# Patient Record
Sex: Female | Born: 1975
Health system: Southern US, Community
[De-identification: ages and names within clinical notes are randomized; demographics above are authoritative.]

## PROBLEM LIST (undated history)

## (undated) DIAGNOSIS — Z87442 Personal history of urinary calculi: Secondary | ICD-10-CM

## (undated) DIAGNOSIS — D649 Anemia, unspecified: Secondary | ICD-10-CM

## (undated) HISTORY — PX: DILATION AND CURETTAGE OF UTERUS: SHX78

---

## 2009-01-23 ENCOUNTER — Ambulatory Visit: Payer: Self-pay | Admitting: Obstetrics and Gynecology

## 2009-02-13 ENCOUNTER — Ambulatory Visit: Payer: Self-pay | Admitting: Obstetrics and Gynecology

## 2013-03-04 ENCOUNTER — Encounter: Payer: Self-pay | Admitting: Internal Medicine

## 2013-03-15 ENCOUNTER — Encounter: Payer: Self-pay | Admitting: Internal Medicine

## 2013-04-15 ENCOUNTER — Encounter: Payer: Self-pay | Admitting: Internal Medicine

## 2013-05-16 ENCOUNTER — Encounter: Payer: Self-pay | Admitting: Internal Medicine

## 2013-09-25 ENCOUNTER — Ambulatory Visit: Payer: Self-pay | Admitting: Physical Medicine and Rehabilitation

## 2014-06-24 ENCOUNTER — Ambulatory Visit: Payer: Self-pay | Admitting: Urology

## 2014-07-01 ENCOUNTER — Ambulatory Visit: Payer: Self-pay

## 2014-09-13 ENCOUNTER — Other Ambulatory Visit: Payer: Self-pay | Admitting: Obstetrics and Gynecology

## 2014-09-13 DIAGNOSIS — Z1231 Encounter for screening mammogram for malignant neoplasm of breast: Secondary | ICD-10-CM

## 2014-09-26 ENCOUNTER — Ambulatory Visit
Admission: RE | Admit: 2014-09-26 | Discharge: 2014-09-26 | Disposition: A | Discharge status: Home / Self Care | Payer: 59 | Source: Ambulatory Visit | Attending: Obstetrics and Gynecology | Admitting: Obstetrics and Gynecology

## 2014-09-26 DIAGNOSIS — Z1231 Encounter for screening mammogram for malignant neoplasm of breast: Secondary | ICD-10-CM | POA: Diagnosis not present

## 2015-08-11 ENCOUNTER — Ambulatory Visit: Payer: Self-pay | Admitting: Physician Assistant

## 2015-08-11 ENCOUNTER — Encounter: Payer: Self-pay | Admitting: Physician Assistant

## 2015-08-11 VITALS — BP 130/90 | HR 78 | Temp 98.3°F

## 2015-08-11 DIAGNOSIS — J302 Other seasonal allergic rhinitis: Secondary | ICD-10-CM

## 2015-08-11 MED ORDER — PREDNISONE 10 MG PO TABS: 30.0000 mg | ORAL_TABLET | Freq: Every day | ORAL | Status: DC

## 2015-08-11 MED ORDER — FLUTICASONE PROPIONATE 50 MCG/ACT NA SUSP: 2.0000 | Freq: Every day | NASAL | Status: DC

## 2015-08-11 MED ORDER — FEXOFENADINE HCL 180 MG PO TABS: 180.0000 mg | ORAL_TABLET | Freq: Every day | ORAL | Status: DC

## 2015-08-11 MED ORDER — OLOPATADINE HCL 0.1 % OP SOLN: 1.0000 [drp] | Freq: Two times a day (BID) | OPHTHALMIC | Status: DC

## 2015-08-11 NOTE — Progress Notes (Signed)
S: c/o runny nose, congestion, watery eyes, itchy eyes, some sinus pressure, sx for about a week, denies fever/chills/body aches, cough, cp/sob, or v/d; using allegra and visine without relief  O: vitals wnl, nad, perrl eomi, conjunctiva injected, tms dull, nasal mucosa swollen and boggy, throat wnl, neck supple no lymph, lungs c t a, cv rrr  A: acute seasonal allergies  P: saline nasal rinse, flonase, allegra, patanol opth gtts, pred 30mg  qd x 3d

## 2015-10-03 ENCOUNTER — Other Ambulatory Visit: Payer: Self-pay | Admitting: Obstetrics and Gynecology

## 2015-10-03 DIAGNOSIS — Z1231 Encounter for screening mammogram for malignant neoplasm of breast: Secondary | ICD-10-CM | POA: Diagnosis not present

## 2015-10-03 DIAGNOSIS — Z01419 Encounter for gynecological examination (general) (routine) without abnormal findings: Secondary | ICD-10-CM | POA: Diagnosis not present

## 2015-10-18 ENCOUNTER — Ambulatory Visit: Admission: RE | Admit: 2015-10-18 | Payer: 59 | Source: Ambulatory Visit

## 2015-12-26 IMAGING — US US PELV - US TRANSVAGINAL
1 series · 13 of 25 positions shown · non-contrast
Comparison: None

CLINICAL DATA: Left adnexal mass identified on CT

EXAM:
TRANSABDOMINAL AND TRANSVAGINAL ULTRASOUND OF PELVIS
TECHNIQUE: Both transabdominal and transvaginal ultrasound examinations of the
pelvis were performed. Transabdominal technique was performed for
global imaging of the pelvis including uterus, ovaries, adnexal
regions, and pelvic cul-de-sac. It was necessary to proceed with
endovaginal exam following the transabdominal exam to visualize the
adnexal structures.

[Series 1: us pelv - us transvaginal · 0.24mm/px · 13 of 126 slices shown]
[im 1/126]
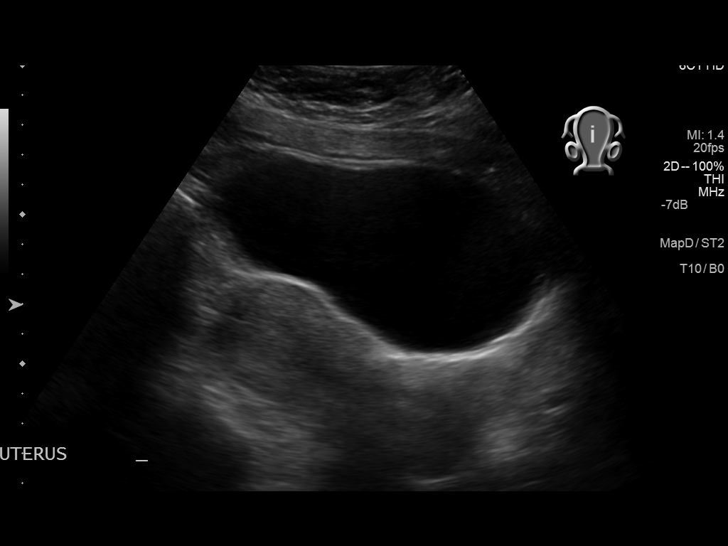
[im 11/126]
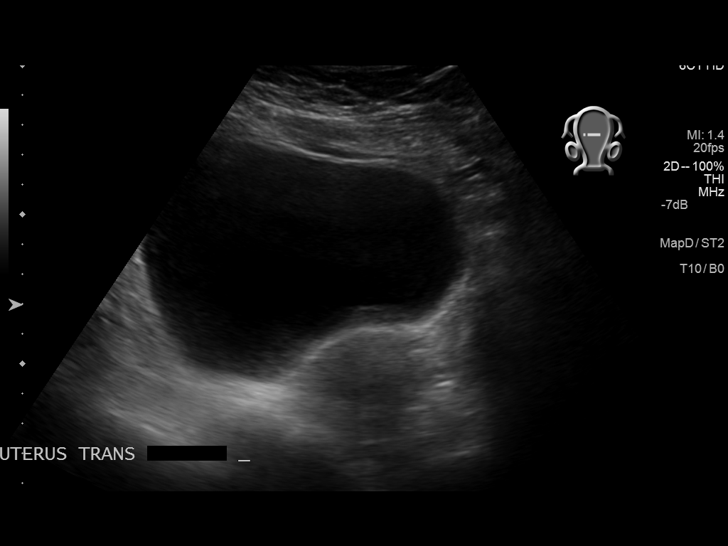
[im 21/126]
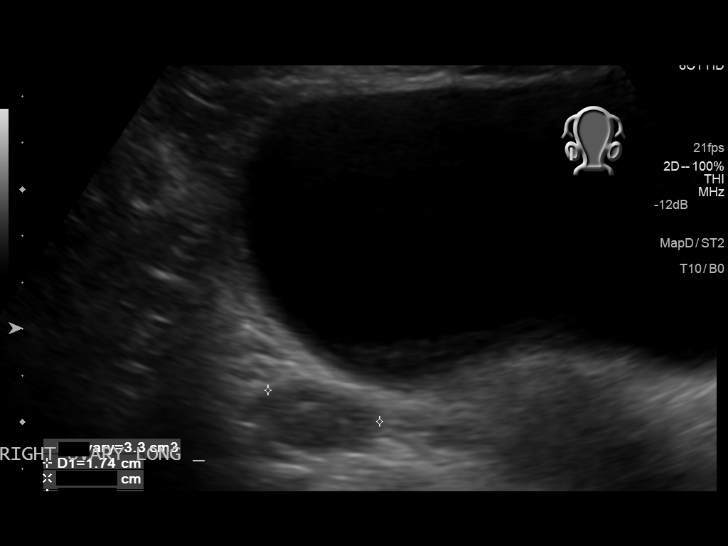
[im 32/126]
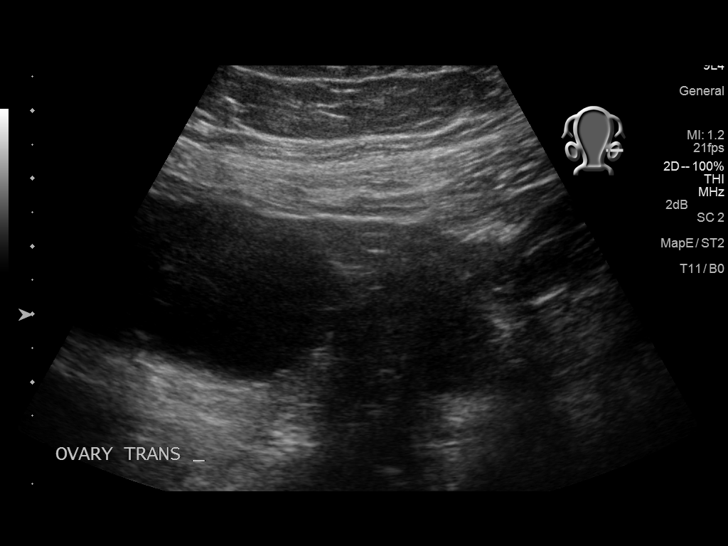
[im 42/126]
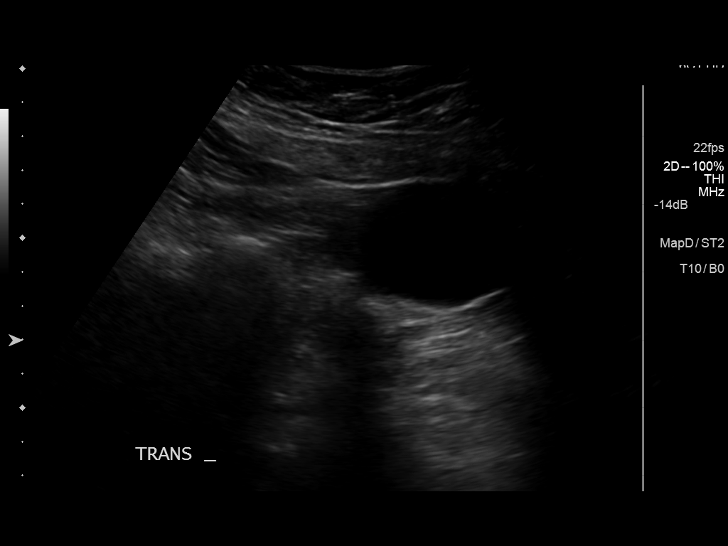
[im 53/126]
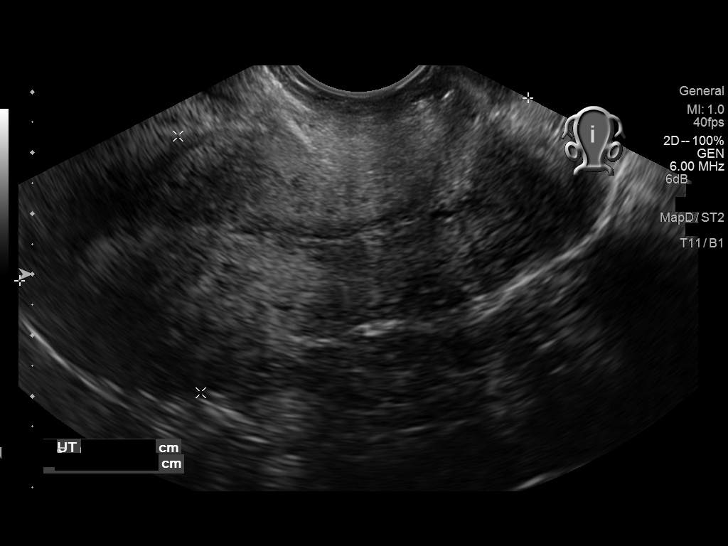
[im 63/126]
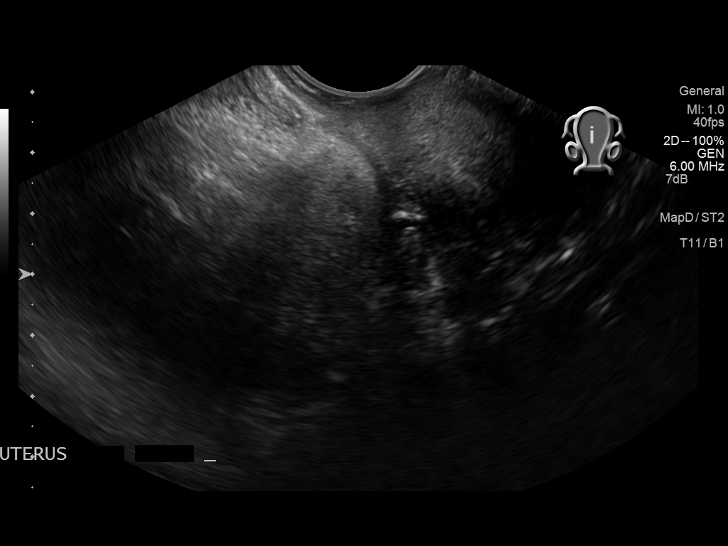
[im 73/126]
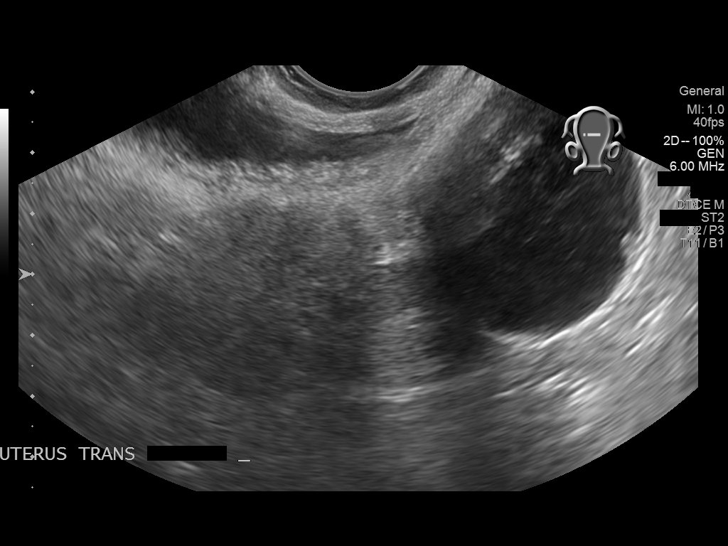
[im 84/126]
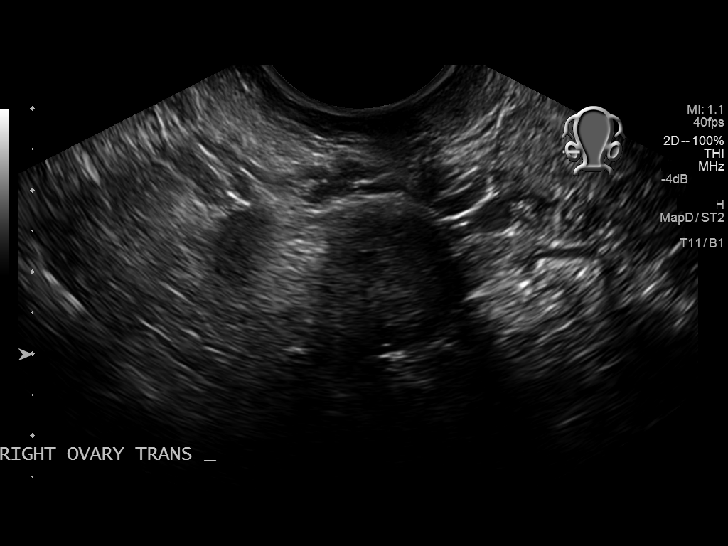
[im 94/126]
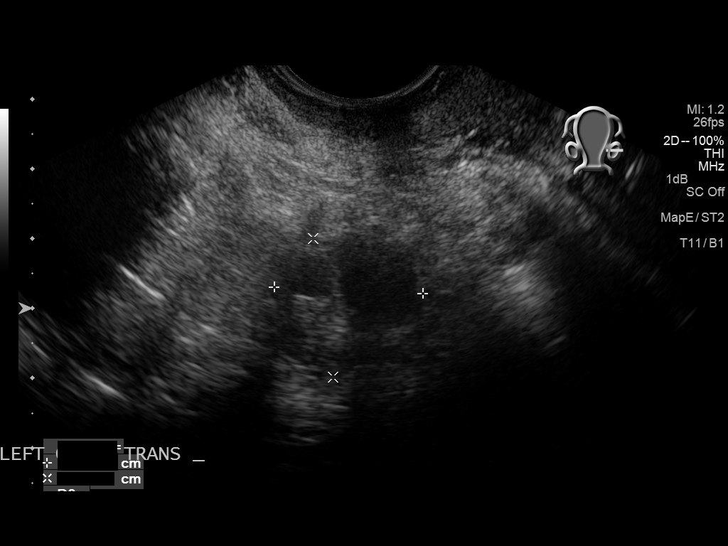
[im 105/126]
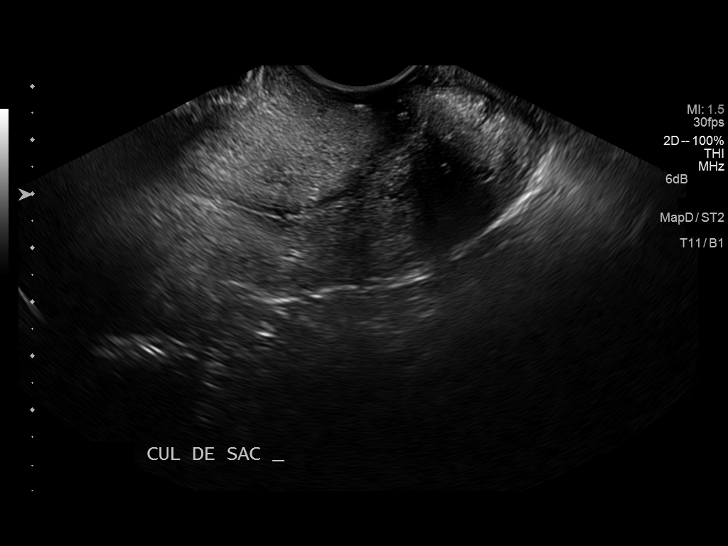
[im 115/126]
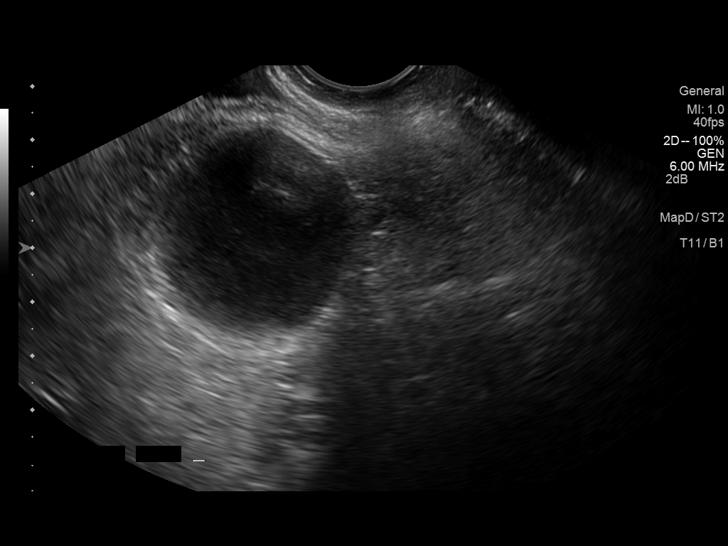
[im 126/126]
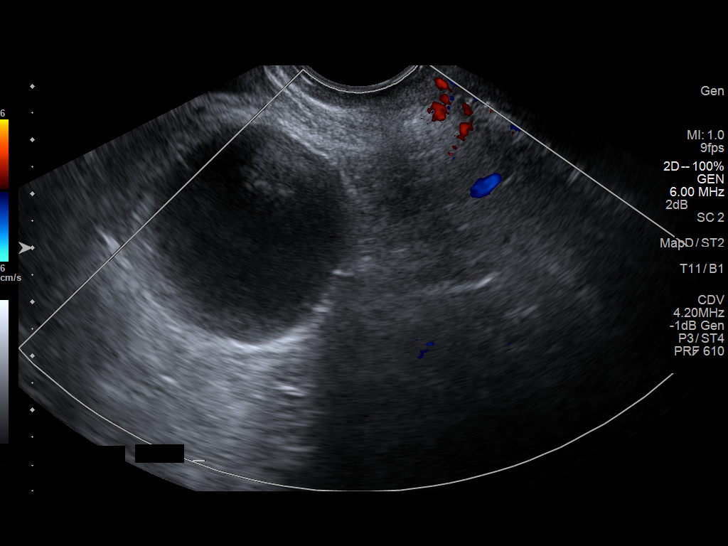

[13 of 25 positions shown; findings below may reference images not displayed]

FINDINGS: Uterus

Measurements: 9.4 x 3.8 x 4.8 cm. No fibroids or other mass
visualized.

Endometrium

Thickness: 7.1 mm.  No focal abnormality visualized.

Right ovary

Measurements: 2.0 x 2.0 x 1.5 cm. Paraovarian cyst measures 1.8 x
1.0 x 1.0 cm.

Left ovary

Measurements: 3.1 x 2.0 x 2.1 cm. Normal appearance/no adnexal mass.

Other findings

Left adnexal mass separate from the left ovary measures 4.5 x 3.7 x
4.2 cm. There are diffuse low level echoes throughout this
structure.
IMPRESSION: 1. Indeterminate mass within the left adnexa appears
well-circumscribed and contains diffuse low level echoes. This is
similar in size to CT from 06/24/2014. No internal blood flow is
associated with this structure. A contrast enhanced pelvic MRI may
provide a more definitive assessment of this abnormality.

## 2016-08-28 ENCOUNTER — Ambulatory Visit: Payer: Self-pay | Admitting: Physician Assistant

## 2016-08-28 ENCOUNTER — Encounter: Payer: Self-pay | Admitting: Physician Assistant

## 2016-08-28 VITALS — BP 165/85 | HR 70 | Temp 98.2°F

## 2016-08-28 DIAGNOSIS — J301 Allergic rhinitis due to pollen: Secondary | ICD-10-CM

## 2016-08-28 MED ORDER — OLOPATADINE HCL 0.1 % OP SOLN: 1.0000 [drp] | Freq: Two times a day (BID) | OPHTHALMIC | 12 refills | Status: DC

## 2016-08-28 MED ORDER — MONTELUKAST SODIUM 10 MG PO TABS: 10.0000 mg | ORAL_TABLET | Freq: Every day | ORAL | 3 refills | Status: DC

## 2016-08-28 NOTE — Progress Notes (Signed)
S: c/o runny nose, congestion, watery eyes, some sinus pressure, sx for over a week, denies fever/chills/body aches, cough, cp/sob, or v/d; using allegra, ran out of patanol eye drops, also using flonase, ears are also itching  O: vitals wnl, nad, perrl eomi, conjunctiva wnl, tms dull, nasal mucosa swollen and boggy, throat wnl, neck supple no lymph, lungs c t a, cv rrr  A: acute seasonal allergies  P: saline nasal rinse, switch to zyrtec, refill on patanol opth gtts, rx for singulair 10mg  qhs, can refer to allergist if not improving in 1-2 weeks

## 2016-10-03 DIAGNOSIS — Z01419 Encounter for gynecological examination (general) (routine) without abnormal findings: Secondary | ICD-10-CM | POA: Diagnosis not present

## 2016-10-03 DIAGNOSIS — Z0000 Encounter for general adult medical examination without abnormal findings: Secondary | ICD-10-CM | POA: Diagnosis not present

## 2016-10-03 DIAGNOSIS — R87615 Unsatisfactory cytologic smear of cervix: Secondary | ICD-10-CM | POA: Diagnosis not present

## 2016-10-03 DIAGNOSIS — Z1231 Encounter for screening mammogram for malignant neoplasm of breast: Secondary | ICD-10-CM | POA: Diagnosis not present

## 2017-01-12 DIAGNOSIS — L03113 Cellulitis of right upper limb: Secondary | ICD-10-CM | POA: Diagnosis not present

## 2017-01-12 DIAGNOSIS — W5501XA Bitten by cat, initial encounter: Secondary | ICD-10-CM | POA: Diagnosis not present

## 2017-06-22 DIAGNOSIS — R0981 Nasal congestion: Secondary | ICD-10-CM | POA: Diagnosis not present

## 2017-06-22 DIAGNOSIS — J0100 Acute maxillary sinusitis, unspecified: Secondary | ICD-10-CM | POA: Diagnosis not present

## 2017-06-22 DIAGNOSIS — R1110 Vomiting, unspecified: Secondary | ICD-10-CM | POA: Diagnosis not present

## 2017-06-22 DIAGNOSIS — J329 Chronic sinusitis, unspecified: Secondary | ICD-10-CM | POA: Diagnosis not present

## 2017-06-22 DIAGNOSIS — R51 Headache: Secondary | ICD-10-CM | POA: Diagnosis not present

## 2017-06-22 DIAGNOSIS — J0110 Acute frontal sinusitis, unspecified: Secondary | ICD-10-CM | POA: Diagnosis not present

## 2017-09-26 ENCOUNTER — Encounter: Payer: Self-pay | Admitting: Emergency Medicine

## 2017-09-26 ENCOUNTER — Emergency Department
Admission: EM | Admit: 2017-09-26 | Discharge: 2017-09-26 | Disposition: A | Discharge status: Home / Self Care | Payer: 59 | Attending: Emergency Medicine | Admitting: Emergency Medicine

## 2017-09-26 ENCOUNTER — Other Ambulatory Visit: Payer: Self-pay

## 2017-09-26 DIAGNOSIS — Z23 Encounter for immunization: Secondary | ICD-10-CM | POA: Diagnosis not present

## 2017-09-26 DIAGNOSIS — Y939 Activity, unspecified: Secondary | ICD-10-CM | POA: Insufficient documentation

## 2017-09-26 DIAGNOSIS — S51851A Open bite of right forearm, initial encounter: Secondary | ICD-10-CM | POA: Diagnosis not present

## 2017-09-26 DIAGNOSIS — Y999 Unspecified external cause status: Secondary | ICD-10-CM | POA: Diagnosis not present

## 2017-09-26 DIAGNOSIS — Z79899 Other long term (current) drug therapy: Secondary | ICD-10-CM | POA: Insufficient documentation

## 2017-09-26 DIAGNOSIS — S51811A Laceration without foreign body of right forearm, initial encounter: Secondary | ICD-10-CM | POA: Diagnosis not present

## 2017-09-26 DIAGNOSIS — Y929 Unspecified place or not applicable: Secondary | ICD-10-CM | POA: Insufficient documentation

## 2017-09-26 DIAGNOSIS — S51852A Open bite of left forearm, initial encounter: Secondary | ICD-10-CM | POA: Diagnosis not present

## 2017-09-26 DIAGNOSIS — F17220 Nicotine dependence, chewing tobacco, uncomplicated: Secondary | ICD-10-CM | POA: Diagnosis not present

## 2017-09-26 DIAGNOSIS — W540XXA Bitten by dog, initial encounter: Secondary | ICD-10-CM | POA: Diagnosis not present

## 2017-09-26 MED ORDER — AMOXICILLIN-POT CLAVULANATE 875-125 MG PO TABS: 1.0000 | ORAL_TABLET | Freq: Two times a day (BID) | ORAL | 0 refills | Status: AC

## 2017-09-26 MED ORDER — TETANUS-DIPHTH-ACELL PERTUSSIS 5-2.5-18.5 LF-MCG/0.5 IM SUSP
0.5000 mL | Freq: Once | INTRAMUSCULAR | Status: AC
  Administered 2017-09-26: 0.5 mL via INTRAMUSCULAR
  Filled 2017-09-26: qty 0.5

## 2017-09-26 MED ORDER — LIDOCAINE HCL (PF) 1 % IJ SOLN
5.0000 mL | Freq: Once | INTRAMUSCULAR | Status: AC
  Administered 2017-09-26: 5 mL
  Filled 2017-09-26: qty 5

## 2017-09-26 NOTE — Discharge Instructions (Addendum)
Begin taking Augmentin 875 twice daily for 7 days.  Clean area daily with mild soap and water.  If you begin seeing pus, redness or having fever you should see a medical provider for this.  Take Tylenol as needed for pain.  Sutures should be removed in 10 to 12 days.

## 2017-09-26 NOTE — ED Provider Notes (Signed)
[facility] Emergency Department Provider Note   ____________________________________________   First MD Initiated Contact with Patient 09/26/17 [phone]     (approximate)  I have reviewed the triage vital signs and the nursing notes.   HISTORY  Chief Complaint Animal Bite    HPI Kristine Lara is a 42 y.o. female presents to the ED after being bit by dog this morning.  Patient states that she is setting her neighbors dogs and was trying to get the dog back into the house.  She states the dog did not want to go into the house and became aggressive biting her on her arm.  There were no other injuries.  Patient states rabies is up-to-date on the dog.  Patient possibly had her last tetanus 9 years ago.  She rates her pain as a 5/10.   History reviewed. No pertinent past medical history.  There are no active problems to display for this patient.   History reviewed. No pertinent surgical history.  Prior to Admission medications   Medication Sig Start Date End Date Taking? Authorizing Provider  amoxicillin-clavulanate (AUGMENTIN) 875-125 MG tablet Take 1 tablet by mouth 2 (two) times daily for 7 days. 09/26/17 10/03/17  Tommi Rumps, PA-C  fexofenadine (ALLEGRA) 180 MG tablet Take 1 tablet (180 mg total) by mouth daily. 08/11/15   Fisher, Roselyn Bering, PA-C  fluticasone (FLONASE) 50 MCG/ACT nasal spray Place 2 sprays into both nostrils daily. 08/11/15   Fisher, Roselyn Bering, PA-C  montelukast (SINGULAIR) 10 MG tablet Take 1 tablet (10 mg total) by mouth at bedtime. 08/28/16   Fisher, Roselyn Bering, PA-C  olopatadine (PATANOL) 0.1 % ophthalmic solution Place 1 drop into both eyes 2 (two) times daily. 08/28/16   Faythe Ghee, PA-C    Allergies Patient has no known allergies.  No family history on file.  Social History Social History   Tobacco Use  . Smoking status: Never Smoker  . Smokeless tobacco: Current User    Types: Snuff  Substance Use Topics  . Alcohol  use: Never    Alcohol/week: 0.0 oz    Frequency: Never  . Drug use: Never    Review of Systems Constitutional: No fever/chills Eyes: No visual changes. ENT: No trauma. Cardiovascular: Denies chest pain. Respiratory: Denies shortness of breath. Musculoskeletal: Minimal right arm pain. Skin: Positive for laceration secondary to dog bite. Neurological: Negative for headaches, focal weakness or numbness. ___________________________________________   PHYSICAL EXAM:  VITAL SIGNS: ED Triage Vitals  Enc Vitals Group     BP 09/26/17 0553 (!) 150/92     Pulse Rate 09/26/17 0553 65     Resp 09/26/17 0553 20     Temp 09/26/17 0553 97.9 F (36.6 C)     Temp Source 09/26/17 0553 Oral     SpO2 09/26/17 0553 99 %     Weight 09/26/17 0554 165 lb (74.8 kg)     Height 09/26/17 0554 5\' 2"  (1.575 m)     Head Circumference --      Peak Flow --      Pain Score 09/26/17 0553 5     Pain Loc --      Pain Edu? --      Excl. in GC? --     Constitutional: Alert and oriented. Well appearing and in no acute distress. Eyes: Conjunctivae are normal.  Head: Atraumatic. Nose: No congestion/rhinnorhea. Mouth/Throat: Mucous membranes are moist.  Oropharynx non-erythematous. Neck: No stridor.   Cardiovascular: Normal rate, regular  rhythm. Grossly normal heart sounds.  Good peripheral circulation. Respiratory: Normal respiratory effort.  No retractions. Lungs CTAB. Gastrointestinal: Soft and nontender. No distention. No abdominal bruits. No CVA tenderness. Musculoskeletal: No lower extremity tenderness nor edema.  No joint effusions. Neurologic:  Normal speech and language. No gross focal neurologic deficits are appreciated. No gait instability. Skin:  Skin is warm, dry.  3.5 cm laceration on the volar aspect of the right forearm.  Bleeding is under control.  There is also a 0.5 cm puncture wound without active bleeding.  No foreign bodies noted.  Patient is able to move digits distal to the injury and  motor sensory function intact.  Capillary refill is less than 3 seconds. Psychiatric: Mood and affect are normal. Speech and behavior are normal.  ____________________________________________   LABS (all labs ordered are listed, but only abnormal results are displayed)  Labs Reviewed - No data to display  PROCEDURES  Procedure(s) performed:   Marland Kitchen.Marland Kitchen.Laceration Repair Date/Time: 09/26/2017 8:06 AM Performed by: Tommi RumpsSummers, Rhonda L, PA-C Authorized by: Tommi RumpsSummers, Rhonda L, PA-C   Consent:    Consent obtained:  Verbal   Consent given by:  Patient   Risks discussed:  Pain, infection and poor wound healing Anesthesia (see MAR for exact dosages):    Anesthesia method:  Local infiltration   Local anesthetic:  Lidocaine 1% w/o epi Laceration details:    Location:  Shoulder/arm   Shoulder/arm location:  R lower arm   Length (cm):  3.5 Repair type:    Repair type:  Simple  5-0 Ethilon was used with 4 simple interrupted sutures.  Area was repaired loosely and patient was made aware that this is due to possible infection from the dog bite.  Critical Care performed: No  ____________________________________________   INITIAL IMPRESSION / ASSESSMENT AND PLAN / ED COURSE  As part of my medical decision making, I reviewed the following data within the electronic MEDICAL RECORD NUMBER Notes from prior ED visits and [place] Controlled Substance Database  Patient is to keep area clean and dry.  She is watch for any signs of infection.  Patient works in [employer] but plans to leave today to drive to [place].  She was placed on Augmentin 875 twice daily for 7 days.  If any signs of infection she is to follow-up with an urgent care or ED.  Sutures are to be removed in 10 to 12 days.  Tdap was given while in the department.  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  ____________________________________________   FINAL CLINICAL IMPRESSION(S) / ED  DIAGNOSES  Final diagnoses:  Dog bite of left forearm, initial encounter     ED Discharge Orders        Ordered    amoxicillin-clavulanate (AUGMENTIN) 875-125 MG tablet  2 times daily     09/26/17 0800       Note:  This document was prepared using Dragon voice recognition software and may include unintentional dictation errors.    Tommi RumpsSummers, Rhonda L, PA-C 09/26/17 1459    Jene EveryKinner, Robert, MD 09/26/17 1515

## 2017-09-26 NOTE — ED Triage Notes (Signed)
Pt presents to ED with dog bite to right wrist. Pt states she is dog is sitting for her neighbor and when she was trying to get the dog back into the house it became aggressive and bit her. Up to date on shots. [place] [facility] [place] at this time.

## 2017-10-10 ENCOUNTER — Encounter: Payer: Self-pay | Admitting: Emergency Medicine

## 2017-10-10 ENCOUNTER — Ambulatory Visit
Admission: EM | Admit: 2017-10-10 | Discharge: 2017-10-10 | Disposition: A | Discharge status: Home / Self Care | Payer: 59 | Attending: Family Medicine | Admitting: Family Medicine

## 2017-10-10 ENCOUNTER — Other Ambulatory Visit: Payer: Self-pay

## 2017-10-10 DIAGNOSIS — R21 Rash and other nonspecific skin eruption: Secondary | ICD-10-CM

## 2017-10-10 MED ORDER — METHYLPREDNISOLONE SODIUM SUCC 40 MG IJ SOLR
80.0000 mg | Freq: Once | INTRAMUSCULAR | Status: AC
  Administered 2017-10-10: 80 mg via INTRAMUSCULAR

## 2017-10-10 MED ORDER — PREDNISONE 10 MG (21) PO TBPK: ORAL_TABLET | Freq: Every day | ORAL | 0 refills | Status: DC

## 2017-10-10 MED ORDER — HYDROXYZINE HCL 25 MG PO TABS: 25.0000 mg | ORAL_TABLET | Freq: Three times a day (TID) | ORAL | 0 refills | Status: DC | PRN

## 2017-10-10 NOTE — ED Provider Notes (Signed)
[facility]    CSN: [id number] Arrival date & time: 10/10/17  1602  History   Chief Complaint Chief Complaint  Patient presents with  . Rash   HPI  42 year old female presents with rash.  Patient reports that she has had rash since Tuesday.  She just came back from [place].  Initial concern for bedbugs but no one else has been affected.  She did not have a rash while she was in [place].  Patient states she returned and developed diffuse rash.  It is very pruritic.  Raised, red.  No medications or interventions tried.  No known exacerbating relieving factors.  No other associated symptoms.  No other complaints.  OB History   None    Home Medications    Prior to Admission medications   Medication Sig Start Date End Date Taking? Authorizing Provider  fexofenadine (ALLEGRA) 180 MG tablet Take 1 tablet (180 mg total) by mouth daily. 08/11/15   Fisher, Roselyn BeringSusan W, PA-C  fluticasone (FLONASE) 50 MCG/ACT nasal spray Place 2 sprays into both nostrils daily. 08/11/15   Fisher, Roselyn BeringSusan W, PA-C  hydrOXYzine (ATARAX/VISTARIL) 25 MG tablet Take 1 tablet (25 mg total) by mouth 3 (three) times daily as needed. 10/10/17   Tommie Samsook, Cayley Pester G, DO  montelukast (SINGULAIR) 10 MG tablet Take 1 tablet (10 mg total) by mouth at bedtime. 08/28/16   Fisher, Roselyn BeringSusan W, PA-C  olopatadine (PATANOL) 0.1 % ophthalmic solution Place 1 drop into both eyes 2 (two) times daily. 08/28/16   Fisher, Roselyn BeringSusan W, PA-C  predniSONE (STERAPRED UNI-PAK 21 TAB) 10 MG (21) TBPK tablet Take by mouth daily. Take 6 tabs by mouth on day 1. Decrease by 1 tablet daily until gone. 10/10/17   Tommie Samsook, Kinza Gouveia G, DO   Social History Social History   Tobacco Use  . Smoking status: Never Smoker  . Smokeless tobacco: Current User    Types: Snuff  Substance Use Topics  . Alcohol use: Never    Alcohol/week: 0.0 oz    Frequency: Never  . Drug use: Never     Allergies   Patient has no known allergies.   Review of Systems Review of  Systems  Constitutional: Negative.   Skin: Positive for rash.   Physical Exam Triage Vital Signs ED Triage Vitals  Enc Vitals Group     BP 10/10/17 1612 125/76     Pulse Rate 10/10/17 1612 68     Resp 10/10/17 1612 14     Temp 10/10/17 1612 98 F (36.7 C)     Temp Source 10/10/17 1612 Oral     SpO2 10/10/17 1612 99 %     Weight 10/10/17 1610 170 lb (77.1 kg)     Height 10/10/17 1610 5\' 2"  (1.575 m)     Head Circumference --      Peak Flow --      Pain Score 10/10/17 1609 0     Pain Loc --      Pain Edu? --      Excl. in GC? --    Updated Vital Signs BP 125/76 (BP Location: Left Arm)   Pulse 68   Temp 98 F (36.7 C) (Oral)   Resp 14   Ht 5\' 2"  (1.575 m)   Wt 170 lb (77.1 kg)   LMP 09/16/2017 (Approximate)   SpO2 99%   BMI 31.09 kg/m    Physical Exam  Constitutional: She is oriented to person, place, and time. She appears well-developed. No distress.  HENT:  Head: Normocephalic and atraumatic.  Pulmonary/Chest: Effort normal. No respiratory distress.  Neurological: She is alert and oriented to person, place, and time.  Skin:  Diffuse, raised erythematous rash. Likely hives.  Psychiatric: She has a normal mood and affect. Her behavior is normal.  Nursing note and vitals reviewed.  UC Treatments / Results  Labs (all labs ordered are listed, but only abnormal results are displayed) Labs Reviewed - No data to display  EKG None  Radiology No results found.  Procedures Procedures (including critical care time)  Medications Ordered in UC Medications  methylPREDNISolone sodium succinate (SOLU-MEDROL) 40 mg/mL injection 80 mg (80 mg Intramuscular Given 10/10/17 1624)    Initial Impression / Assessment and Plan / UC Course  I have reviewed the triage vital signs and the nursing notes.  Pertinent labs & imaging results that were available during my care of the patient were reviewed by me and considered in my medical decision making (see chart for details).      42 year old female presents with rash. Likely hives.  IM Solu-Medrol given today.  Prednisone taper to start tomorrow.  Atarax as needed.  Final Clinical Impressions(s) / UC Diagnoses   Final diagnoses:  Rash     Discharge Instructions     Start the prednisone tomorrow.  Atarax for itching.  If persists, see your PCP or dermatology.  Take care  Dr. Adriana Simas    ED Prescriptions    Medication Sig Dispense Auth. Provider   predniSONE (STERAPRED UNI-PAK 21 TAB) 10 MG (21) TBPK tablet Take by mouth daily. Take 6 tabs by mouth on day 1. Decrease by 1 tablet daily until gone. 21 tablet Jalise Zawistowski G, DO   hydrOXYzine (ATARAX/VISTARIL) 25 MG tablet Take 1 tablet (25 mg total) by mouth 3 (three) times daily as needed. 30 tablet Tommie Sams, DO     Controlled Substance Prescriptions [place] Controlled Substance Registry consulted? Not Applicable  Tommie Sams, DO 10/10/17 1702

## 2017-10-10 NOTE — Discharge Instructions (Signed)
Start the prednisone tomorrow.  Atarax for itching.  If persists, see your PCP or dermatology.  Take care  Dr. Adriana Simasook

## 2017-10-10 NOTE — ED Triage Notes (Signed)
Patient c/o itchy red bumps that started on Tuesday.  Patient states that she recently got back from [place].

## 2017-10-21 DIAGNOSIS — Z01419 Encounter for gynecological examination (general) (routine) without abnormal findings: Secondary | ICD-10-CM | POA: Diagnosis not present

## 2017-10-21 DIAGNOSIS — Z0000 Encounter for general adult medical examination without abnormal findings: Secondary | ICD-10-CM | POA: Diagnosis not present

## 2017-10-21 DIAGNOSIS — R200 Anesthesia of skin: Secondary | ICD-10-CM | POA: Diagnosis not present

## 2017-10-21 DIAGNOSIS — Z1231 Encounter for screening mammogram for malignant neoplasm of breast: Secondary | ICD-10-CM | POA: Diagnosis not present

## 2018-01-22 DIAGNOSIS — N390 Urinary tract infection, site not specified: Secondary | ICD-10-CM | POA: Diagnosis not present

## 2018-01-22 DIAGNOSIS — R300 Dysuria: Secondary | ICD-10-CM | POA: Diagnosis not present

## 2018-01-22 DIAGNOSIS — R200 Anesthesia of skin: Secondary | ICD-10-CM | POA: Diagnosis not present

## 2018-01-22 DIAGNOSIS — Z7689 Persons encountering health services in other specified circumstances: Secondary | ICD-10-CM | POA: Diagnosis not present

## 2018-02-06 DIAGNOSIS — F40243 Fear of flying: Secondary | ICD-10-CM | POA: Diagnosis not present

## 2018-02-06 DIAGNOSIS — R350 Frequency of micturition: Secondary | ICD-10-CM | POA: Diagnosis not present

## 2018-03-16 DIAGNOSIS — N898 Other specified noninflammatory disorders of vagina: Secondary | ICD-10-CM | POA: Diagnosis not present

## 2018-03-16 DIAGNOSIS — R3915 Urgency of urination: Secondary | ICD-10-CM | POA: Diagnosis not present

## 2018-03-16 DIAGNOSIS — Z3200 Encounter for pregnancy test, result unknown: Secondary | ICD-10-CM | POA: Diagnosis not present

## 2018-03-16 DIAGNOSIS — N3001 Acute cystitis with hematuria: Secondary | ICD-10-CM | POA: Diagnosis not present

## 2018-05-29 ENCOUNTER — Encounter: Payer: Self-pay | Admitting: Physician Assistant

## 2018-05-29 ENCOUNTER — Ambulatory Visit (INDEPENDENT_AMBULATORY_CARE_PROVIDER_SITE_OTHER): Payer: Self-pay | Admitting: Physician Assistant

## 2018-05-29 VITALS — BP 122/86 | HR 70 | Temp 98.1°F | Resp 16 | Ht 62.0 in | Wt 162.0 lb

## 2018-05-29 DIAGNOSIS — J029 Acute pharyngitis, unspecified: Secondary | ICD-10-CM

## 2018-05-29 LAB — POCT RAPID STREP A (OFFICE): Rapid Strep A Screen: NEGATIVE

## 2018-05-29 MED ORDER — PSEUDOEPH-BROMPHEN-DM 30-2-10 MG/5ML PO SYRP: 5.0000 mL | ORAL_SOLUTION | Freq: Four times a day (QID) | ORAL | 0 refills | Status: DC | PRN

## 2018-05-29 MED ORDER — AMOXICILLIN 500 MG PO CAPS: 500.0000 mg | ORAL_CAPSULE | Freq: Two times a day (BID) | ORAL | 0 refills | Status: DC

## 2018-05-29 NOTE — Patient Instructions (Addendum)
Sore Throat  Start antibiotic as prescribed. Complete entire course. Use bromfed syrup throughout the day for congestion and cough.  Start your flonase daily. - You may take ibuprofen 600 mg-800mg  with food for your throat every 8 hours. Tylenol can also be used for sore throat. - You may also use 2 tablespoons of warm honey every 4-6 hours to coat and soothe your throat. - Drink plenty of water, at least 64 ounces daily and rest to make sure your body has a chance to get better. - Follow up with primary care or local urgent care if no improvement with treatment plan. Seek care sooner if symptoms worsen or you develop new concerning symptoms.   When you have a sore throat, your throat may feel:  Tender.  Burning.  Irritated.  Scratchy.  Painful when you swallow.  Painful when you talk. Many things can cause a sore throat, such as:  An infection.  Allergies.  Dry air.  Smoke or pollution.  Radiation treatment.  Gastroesophageal reflux disease (GERD).  A tumor. A sore throat can be the first sign of another sickness. It can happen with other problems, like:  Coughing.  Sneezing.  Fever.  Swelling in the neck. Most sore throats go away without treatment. Follow these instructions at home:      Take over-the-counter medicines only as told by your doctor. ? If your child has a sore throat, do not give your child aspirin.  Drink enough fluids to keep your pee (urine) pale yellow.  Rest when you feel you need to.  To help with pain: ? Sip warm liquids, such as broth, herbal tea, or warm water. ? Eat or drink cold or frozen liquids, such as frozen ice pops. ? Gargle with a salt-water mixture 3-4 times a day or as needed. To make a salt-water mixture, add -1 tsp (3-6 g) of salt to 1 cup (237 mL) of warm water. Mix it until you cannot see the salt anymore. ? Suck on hard candy or throat lozenges. ? Put a cool-mist humidifier in your bedroom at night. ? Sit in  the bathroom with the door closed for 5-10 minutes while you run hot water in the shower.  Do not use any products that contain nicotine or tobacco, such as cigarettes, e-cigarettes, and chewing tobacco. If you need help quitting, ask your doctor.  Wash your hands well and often with soap and water. If soap and water are not available, use hand sanitizer. Contact a doctor if:  You have a fever for more than 2-3 days.  You keep having symptoms for more than 2-3 days.  Your throat does not get better in 7 days.  You have a fever and your symptoms suddenly get worse.  Your child who is 3 months to 64 years old has a temperature of 102.2F (39C) or higher. Get help right away if:  You have trouble breathing.  You cannot swallow fluids, soft foods, or your saliva.  You have swelling in your throat or neck that gets worse.  You keep feeling sick to your stomach (nauseous).  You keep throwing up (vomiting). Summary  A sore throat is pain, burning, irritation, or scratchiness in the throat. Many things can cause a sore throat.  Take over-the-counter medicines only as told by your doctor. Do not give your child aspirin.  Drink plenty of fluids, and rest as needed.  Contact a doctor if your symptoms get worse or your sore throat does not get better  within 7 days. This information is not intended to replace advice given to you by your health care provider. Make sure you discuss any questions you have with your health care provider. Document Released: 01/09/2008 Document Revised: 09/01/2017 Document Reviewed: 09/01/2017 Elsevier Interactive Patient Education  2019 [employer].

## 2018-05-29 NOTE — Progress Notes (Signed)
MRN: [id number] DOB: 10-20-1975  Subjective:   Kristine Lara is a 43 y.o. female presenting for chief complaint of Sore Throat (x4d) . 4 day hx of worsening sore throat. Has associated pain w/ swallowing. Has had some nasal congestion and dry cough as well but these sx are not as prominent as the sore throat.  Denies sinus pain, inability to swallow, voice change, productive cough, wheezing, shortness of breath, chest pain and myalgia, nausea, vomiting, abdominal pain and diarrhea. Has tried mucinex and tylenol with no relief. 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 Has history of seasonal allergies. Denies history of asthma, DM, and HTN. No hx of mono. Has had hx of strep and this reminds her of that time.  Patient has had flu shot this season. Denies smoking. Denies recent oral sex exposure. Denies any other aggravating or relieving factors, no other questions or concerns.  Review of Systems  Constitutional: Negative for diaphoresis.  Musculoskeletal: Negative for neck pain.  Skin: Negative for rash.  Neurological: Negative for dizziness and headaches.    Kristine Lara has a current medication list which includes the following prescription(s): vitamin b-12, amoxicillin, brompheniramine-pseudoephedrine-dm, fexofenadine, fluticasone, hydroxyzine, montelukast, and olopatadine. Also has No Known Allergies.  Kristine Lara  has no past medical history on file. Also  has no past surgical history on file.   Objective:   Vitals: BP 122/86 (BP Location: Right Arm, Patient Position: Sitting, Cuff Size: Normal)   Pulse 70   Temp 98.1 F (36.7 C)   Resp 16   Ht 5\' 2"  (1.575 m)   Wt 162 lb (73.5 kg)   LMP 05/28/2018   SpO2 98%   BMI 29.63 kg/m   Physical Exam Vitals signs reviewed.  Constitutional:      General: She is not in acute distress.    Appearance: She is well-developed. She is not ill-appearing or toxic-appearing.  HENT:     Head: Normocephalic and atraumatic.   Right Ear: Tympanic membrane, ear canal and external ear normal.     Left Ear: Tympanic membrane, ear canal and external ear normal.     Nose: Mucosal edema, congestion and rhinorrhea present. Rhinorrhea is clear.     Right Sinus: No maxillary sinus tenderness or frontal sinus tenderness.     Left Sinus: No maxillary sinus tenderness or frontal sinus tenderness.     Mouth/Throat:     Lips: Pink.     Mouth: Mucous membranes are moist.     Pharynx: Uvula midline. Posterior oropharyngeal erythema present. No pharyngeal swelling or uvula swelling.     Tonsils: Tonsillar exudate (b/l exudate L>R) present. No tonsillar abscesses. Swelling: 2+ on the right. 2+ on the left.  Eyes:     Conjunctiva/sclera: Conjunctivae normal.  Neck:     Musculoskeletal: Full passive range of motion without pain and normal range of motion. No neck rigidity.     Trachea: Trachea and phonation normal.  Cardiovascular:     Rate and Rhythm: Normal rate and regular rhythm.     Heart sounds: Normal heart sounds.  Pulmonary:     Effort: Pulmonary effort is normal.     Breath sounds: Normal breath sounds. No decreased breath sounds, wheezing, rhonchi or rales.  Lymphadenopathy:     Head:     Right side of head: No submental, submandibular, tonsillar, preauricular, posterior auricular or occipital adenopathy.     Left side of head: No submental, submandibular, tonsillar, preauricular, posterior auricular or occipital adenopathy.  Cervical: Cervical adenopathy present.     Right cervical: Superficial cervical adenopathy present.     Left cervical: Superficial cervical adenopathy present.     Upper Body:     Right upper body: No supraclavicular adenopathy.     Left upper body: No supraclavicular adenopathy.  Skin:    General: Skin is warm and dry.  Neurological:     Mental Status: She is alert.    Results for orders placed or performed in visit on 05/29/18 (from the past 24 hour(s))  POCT rapid strep A      Status: Normal   Collection Time: 05/29/18  2:42 PM  Result Value Ref Range   Rapid Strep A Screen Negative Negative    Assessment and Plan :  1. Sore throat Pt is overall well appearing, NAD. VSS. She is afebrile. POC strep test negative. Discussed with pt that we do not obtain strep cultures in our office. With physical exam findings of b/l tonsillar swelling, exudate, and cervical adenopathy and worsening of symptoms despite OTC treatment, would recommend treating with oral abx at this time to cover empirically for bacterial etiology. DDx does include viral pharyngitis.  No nuchal rigidity, peritonsillar erythema or edema, drooling, stridor, hot potato voice, or signs of respiratory distress noted on exam to suggest more concerning etiology of sore throat such as epiglottitis, peritonsillar abscess, retropharyngeal abscess. Rec she f/u with family doctor or local urgent care if symptoms do not improve with tx plan. Seek care sooner if symptoms worsen/develops new concerning sx with tx plan. Pt voices her understanding.  - POCT rapid strep A - amoxicillin (AMOXIL) 500 MG capsule; Take 1 capsule (500 mg total) by mouth 2 (two) times daily.  Dispense: 20 capsule; Refill: 0 - brompheniramine-pseudoephedrine-DM 30-2-10 MG/5ML syrup; Take 5 mLs by mouth 4 (four) times daily as needed.  Dispense: 120 mL; Refill: 0  Kristine Core, PA-C  [facility] 05/29/2018 3:20 PM

## 2018-07-15 ENCOUNTER — Ambulatory Visit (INDEPENDENT_AMBULATORY_CARE_PROVIDER_SITE_OTHER): Payer: Self-pay | Admitting: Physician Assistant

## 2018-07-15 ENCOUNTER — Other Ambulatory Visit: Payer: Self-pay

## 2018-07-15 VITALS — BP 140/90 | HR 73 | Temp 98.1°F | Resp 16 | Wt 164.0 lb

## 2018-07-15 DIAGNOSIS — E669 Obesity, unspecified: Secondary | ICD-10-CM | POA: Insufficient documentation

## 2018-07-15 DIAGNOSIS — L2489 Irritant contact dermatitis due to other agents: Secondary | ICD-10-CM

## 2018-07-15 MED ORDER — PREDNISONE 10 MG (21) PO TBPK: ORAL_TABLET | ORAL | 0 refills | Status: DC

## 2018-07-15 MED ORDER — TRIAMCINOLONE ACETONIDE 0.1 % EX CREA: 1.0000 "application " | TOPICAL_CREAM | Freq: Two times a day (BID) | CUTANEOUS | 0 refills | Status: AC

## 2018-07-15 MED ORDER — LEVOCETIRIZINE DIHYDROCHLORIDE 5 MG PO TABS: 5.0000 mg | ORAL_TABLET | Freq: Every evening | ORAL | 0 refills | Status: DC

## 2018-07-15 NOTE — Patient Instructions (Signed)
Thank you for choosing [facility] for your health care needs.  You have been diagnosed with contact dermatitis (rash, most likely due to new bra).  Take medications as prescribed: Meds ordered this encounter  Medications  . predniSONE (STERAPRED UNI-PAK 21 TAB) 10 MG (21) TBPK tablet    Sig: Take 6 tabs po qd x 1d, then 5 tabs po qd x 1d, then 4 tabs po qd x 1d, then 3 tabs po qd x 1d, then 2 tabs po qd x 1d, then 1 tab po qd x1d    Dispense:  21 tablet    Refill:  0    Order Specific Question:   Supervising Provider    Answer:   MILLER, BRIAN [[id number]]  . levocetirizine (XYZAL) 5 MG tablet    Sig: Take 1 tablet (5 mg total) by mouth every evening for 7 days.    Dispense:  7 tablet    Refill:  0    Order Specific Question:   Supervising Provider    Answer:   MILLER, BRIAN [[id number]]  . triamcinolone cream (KENALOG) 0.1 %    Sig: Apply 1 application topically 2 (two) times daily for 7 days.    Dispense:  30 g    Refill:  0    Order Specific Question:   Supervising Provider    Answer:   Eber Hong [[id number]]   May apply cool compresses over rash. During the day may use Zyrtec or Claritin for itchiness.  Follow-up with [facility] or urgent care or family physician in 2-3 days if not improving. Sooner with any worsening symptoms.  Hope you feel better soon!  Contact Dermatitis Dermatitis is redness, soreness, and swelling (inflammation) of the skin. Contact dermatitis is a reaction to something that touches the skin. There are two types of contact dermatitis:  Irritant contact dermatitis. This happens when something bothers (irritates) your skin, like soap.  Allergic contact dermatitis. This is caused when you are exposed to something that you are allergic to, such as poison ivy. What are the causes?  Common causes of irritant contact dermatitis include: ? Makeup. ? Soaps. ? Detergents. ? Bleaches. ? Acids. ? Metals, such as nickel.  Common causes of allergic contact dermatitis  include: ? Plants. ? Chemicals. ? Jewelry. ? Latex. ? Medicines. ? Preservatives in products, such as clothing. What increases the risk?  Having a job that exposes you to things that bother your skin.  Having asthma or eczema. What are the signs or symptoms? Symptoms may happen anywhere the irritant has touched your skin. Symptoms include:  Dry or flaky skin.  Redness.  Cracks.  Itching.  Pain or a burning feeling.  Blisters.  Blood or clear fluid draining from skin cracks. With allergic contact dermatitis, swelling may occur. This may happen in places such as the eyelids, mouth, or genitals. How is this treated?  This condition is treated by checking for the cause of the reaction and protecting your skin. Treatment may also include: ? Steroid creams, ointments, or medicines. ? Antibiotic medicines or other ointments, if you have a skin infection. ? Lotion or medicines to help with itching. ? A bandage (dressing). Follow these instructions at home: Skin care  Moisturize your skin as needed.  Put cool cloths on your skin.  Put a baking soda paste on your skin. Stir water into baking soda until it looks like a paste.  Do not scratch your skin.  Avoid having things rub up against your skin.  Avoid the  use of soaps, perfumes, and dyes. Medicines  Take or apply over-the-counter and prescription medicines only as told by your doctor.  If you were prescribed an antibiotic medicine, take or apply it as told by your doctor. Do not stop using it even if your condition starts to get better. Bathing  Take a bath with: ? Epsom salts. ? Baking soda. ? Colloidal oatmeal.  Bathe less often.  Bathe in warm water. Avoid using hot water. Bandage care  If you were given a bandage, change it as told by your health care provider.  Wash your hands with soap and water before and after you change your bandage. If soap and water are not available, use hand sanitizer.  General instructions  Avoid the things that caused your reaction. If you do not know what caused it, keep a journal. Write down: ? What you eat. ? What skin products you use. ? What you drink. ? What you wear in the area that has symptoms. This includes jewelry.  Check the affected areas every day for signs of infection. Check for: ? More redness, swelling, or pain. ? More fluid or blood. ? Warmth. ? Pus or a bad smell.  Keep all follow-up visits as told by your doctor. This is important. Contact a doctor if:  You do not get better with treatment.  Your condition gets worse.  You have signs of infection, such as: ? More swelling. ? Tenderness. ? More redness. ? Soreness. ? Warmth.  You have a fever.  You have new symptoms. Get help right away if:  You have a very bad headache.  You have neck pain.  Your neck is stiff.  You throw up (vomit).  You feel very sleepy.  You see red streaks coming from the area.  Your bone or joint near the area hurts after the skin has healed.  The area turns darker.  You have trouble breathing. Summary  Dermatitis is redness, soreness, and swelling of the skin.  Symptoms may occur where the irritant has touched you.  Treatment may include medicines and skin care.  If you do not know what caused your reaction, keep a journal.  Contact a doctor if your condition gets worse or you have signs of infection. This information is not intended to replace advice given to you by your health care provider. Make sure you discuss any questions you have with your health care provider. Document Released: 01/27/2009 Document Revised: 10/15/2017 Document Reviewed: 10/15/2017 Elsevier Interactive Patient Education  2019 [employer].

## 2018-07-15 NOTE — Progress Notes (Addendum)
Patient ID: Kristine Lara DOB: 09/17/1975 AGE: 43 y.o. MRN: [id number]   PCP: Mickey Farber, MD   Chief Complaint:  Chief Complaint  Patient presents with  . Rash    x1wk     Subjective:    HPI:  Kristine Lara is a 43 y.o. female presents for evaluation  Chief Complaint  Patient presents with  . Rash    x10wk   44 year old female presents to [facility] with 1 week history of rash. Located on superior aspect of breasts bilaterally. Associated faint erythema. Significant pruritis. Began day after wearing new bra, regular bra (not sports bra). Did not wash prior to first wear. Bra purchased at [employer]. Patient has not worn bra again. Patient has been applying OTC hydrocortisone cream with minimal relief. Denies spreading/worsening of rash. Denies pain with rash. Denies papules/vesicles. Denies fever, chills, headache, body aches, nausea/vomiting, discharge/drainage from rash, breast edema, nipple rash/discharge/bleeding, URI symptoms. Patient denies any additional known new exposures. No previous history of similar rash. Patient denies previous sensitivity to new clothes, clothing detergent, jewelry, etc.  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 Denies known exposure to Covid19 patient. Denies recent travel. Denies fever, cough, or SOB.  Patient seen at [facility] on 10/10/2017 with diffuse pruritic rash. Etiology unknown. Suspected hives. Given solumedrol IM injection. Prescribed prednisone taper and Atarax.   Patient regularly followed by [facility]. Treated for allergic rhinitis and low back pain. Patient treats seasonal allergies with Allegra and Flonase. Patient last seen by PCP on 02/06/2018. Patient with no diagnosis of DM2. Last A1C 5.4 on 10/21/2017 (available in Care Everywhere).  Patient regularly followed by Ob/Gyn, [facility]. Last seen 03/16/2018 for acute cystitis, vaginal  discharge, and possible pregnancy. Diagnosed with UTI and prescribed Keflex 500mg  bid x 7 days. Negative pregnancy test; patient in monogamous relationship, uses condoms. Patient's last well woman exam on 10/03/2015. Screening mammogram ordered; patient was no show to appointment. Last screening mammogram on 09/27/2014, negative. Patient denies family history of breast cancer.  A limited review of symptoms was performed, pertinent positives and negatives as mentioned in HPI.  The following portions of the patient's history were reviewed and updated as appropriate: allergies, current medications and past medical history.  Patient Active Problem List   Diagnosis Date Noted  . Obesity (BMI 30-39.9) 07/15/2018    No Known Allergies  Current Outpatient Medications on File Prior to Visit  Medication Sig Dispense Refill  . fexofenadine (ALLEGRA) 180 MG tablet Take 1 tablet (180 mg total) by mouth daily. 30 tablet 12  . fluticasone (FLONASE) 50 MCG/ACT nasal spray Place 2 sprays into both nostrils daily. 16 g 6  . vitamin B-12 (CYANOCOBALAMIN) 1000 MCG tablet Take by mouth.     No current facility-administered medications on file prior to visit.        Objective:   Vitals:   07/15/18 1011  BP: 140/90  Pulse: 73  Resp: 16  Temp: 98.1 F (36.7 C)  SpO2: 99%     Wt Readings from Last 3 Encounters:  07/15/18 164 lb (74.4 kg)  05/29/18 162 lb (73.5 kg)  10/10/17 170 lb (77.1 kg)    Physical Exam:   General Appearance:  Patient sitting comfortably on examination table. Conversational. Peri [place] self-historian. In no acute distress. Afebrile.   Head:  Normocephalic, without obvious abnormality, atraumatic  Eyes:  PERRL, conjunctiva/corneas clear, EOM's intact  Neck: Supple, symmetrical,  trachea midline, no adenopathy  Lungs:   Clear to auscultation bilaterally, respirations unlabored. Good aeration. No rales, rhonchi, crackles or wheezing.  Heart:  Regular rate and rhythm, S1 and S2  normal, no murmur, rub, or gallop  Extremities: Extremities normal, atraumatic, no cyanosis or edema  Pulses: 2+ and symmetric  Skin: Faintly erythematous macular rash with miniscule scabs and linear scratches (evidence of excoriation) along superior aspect of breasts bilaterally. No involvement to tail of Spence. No areola/nipple involvement. No well demarcated border, dry/flaky skin, or satellite lesions. No streaking redness. No palpable warmth. No tenderness with palpation. No palpable underlying mass/abscess. No discharge/drainage from rash.  Lymph nodes: Cervical, supraclavicular, and axillary nodes normal  Neurologic: Normal    Assessment & Plan:    Exam findings, diagnosis etiology and medication use and indications reviewed with patient. Follow-Up and discharge instructions provided. No emergent/urgent issues found on exam.  Patient education was provided.   Patient verbalized understanding of information provided and agrees with plan of care (POC), all questions answered. The patient is advised to call or return to clinic if condition does not see an improvement in symptoms, or to seek the care of the closest emergency department if condition worsens with the below plan.    1. Irritant contact dermatitis due to other agents  - predniSONE (STERAPRED UNI-PAK 21 TAB) 10 MG (21) TBPK tablet; Take 6 tabs po qd x 1d, then 5 tabs po qd x 1d, then 4 tabs po qd x 1d, then 3 tabs po qd x 1d, then 2 tabs po qd x 1d, then 1 tab po qd x1d  Dispense: 21 tablet; Refill: 0 - levocetirizine (XYZAL) 5 MG tablet; Take 1 tablet (5 mg total) by mouth every evening for 7 days.  Dispense: 7 tablet; Refill: 0 - triamcinolone cream (KENALOG) 0.1 %; Apply 1 application topically 2 (two) times daily for 7 days.  Dispense: 30 g; Refill: 0  Patient with one week history of pruritic erythematous macular rash on superior aspect of breasts bilaterally. Began day after wearing new bra. Suspect contact dermatitis;  irritant vs allergic. Prescribed steroid cream, short tapering oral steroid course, and antihistamine for night time usage. Discussed possibility of different etiology including fungal, yeast, inflammatory breast cancer, etc. Low suspicion for other etiologies at this time; rare, atypical appearance for inflammatory breast cancer, patient regularly seen by Ob/Gyn, no family history of breast CA. Also discussed possibility of developing secondary bacterial cellulitis due to extent of excoriation. Advised patient f/u with [facility], urgent care, or PCP in 2-3 days if not improving, sooner with any worsening symptoms. Patient agreed with plan.   [name], MHS, PA-C Rulon Sera, MHS, PA-C Advanced Practice Provider [facility]  [phone] [place], [place] [address] (p): [phone] [email or web address]

## 2018-07-17 ENCOUNTER — Telehealth: Payer: Self-pay | Admitting: Emergency Medicine

## 2018-07-17 NOTE — Telephone Encounter (Signed)
Spoke with patient whom informed me that she is doing better. Still taking medication.

## 2018-08-15 ENCOUNTER — Other Ambulatory Visit: Payer: Self-pay

## 2018-08-15 ENCOUNTER — Ambulatory Visit (INDEPENDENT_AMBULATORY_CARE_PROVIDER_SITE_OTHER): Payer: 59

## 2018-08-15 ENCOUNTER — Encounter: Payer: Self-pay | Admitting: Emergency Medicine

## 2018-08-15 ENCOUNTER — Ambulatory Visit
Admission: EM | Admit: 2018-08-15 | Discharge: 2018-08-15 | Disposition: A | Discharge status: Home / Self Care | Payer: 59 | Attending: Family Medicine | Admitting: Family Medicine

## 2018-08-15 DIAGNOSIS — W208XXA Other cause of strike by thrown, projected or falling object, initial encounter: Secondary | ICD-10-CM

## 2018-08-15 DIAGNOSIS — S60032A Contusion of left middle finger without damage to nail, initial encounter: Secondary | ICD-10-CM | POA: Diagnosis not present

## 2018-08-15 DIAGNOSIS — M79645 Pain in left finger(s): Secondary | ICD-10-CM | POA: Diagnosis not present

## 2018-08-15 DIAGNOSIS — S6992XA Unspecified injury of left wrist, hand and finger(s), initial encounter: Secondary | ICD-10-CM | POA: Diagnosis not present

## 2018-08-15 MED ORDER — HYDROCODONE-ACETAMINOPHEN 5-325 MG PO TABS: ORAL_TABLET | ORAL | 0 refills | Status: DC

## 2018-08-15 NOTE — Discharge Instructions (Signed)
Rest, ice, over the counter tylenol and/or advil °

## 2018-08-15 NOTE — ED Provider Notes (Signed)
[facility]    CSN: [id number] Arrival date & time: 08/15/18  1246     History   Chief Complaint Chief Complaint  Patient presents with  . Finger Injury    HPI Kristine Lara is a 43 y.o. female.   43 yo female with a c/o left middle finger pain after a couch she was moving fell on her finger about 2 hours ago. States she can move it but it's swollen and painful; no bleeding.      History reviewed. No pertinent past medical history.  Patient Active Problem List   Diagnosis Date Noted  . Obesity (BMI 30-39.9) 07/15/2018    History reviewed. No pertinent surgical history.  OB History   No obstetric history on file.      Home Medications    Prior to Admission medications   Medication Sig Start Date End Date Taking? Authorizing Provider  fexofenadine (ALLEGRA) 180 MG tablet Take 1 tablet (180 mg total) by mouth daily. 08/11/15   Fisher, Roselyn Bering, PA-C  fluticasone (FLONASE) 50 MCG/ACT nasal spray Place 2 sprays into both nostrils daily. 08/11/15   [name], PA-C  HYDROcodone-acetaminophen (NORCO/VICODIN) 5-325 MG tablet 1-2 tabs po bid prn 08/15/18   Payton Mccallum, MD  levocetirizine (XYZAL) 5 MG tablet Take 1 tablet (5 mg total) by mouth every evening for 7 days. 07/15/18 07/22/18  [name], PA-C  predniSONE (STERAPRED UNI-PAK 21 TAB) 10 MG (21) TBPK tablet Take 6 tabs po qd x 1d, then 5 tabs po qd x 1d, then 4 tabs po qd x 1d, then 3 tabs po qd x 1d, then 2 tabs po qd x 1d, then 1 tab po qd x1d 07/15/18   [name], PA-C  vitamin B-12 (CYANOCOBALAMIN) 1000 MCG tablet Take by mouth.    [provider]    Family History History reviewed. No pertinent family history.  Social History Social History   Tobacco Use  . Smoking status: Never Smoker  . Smokeless tobacco: Current User    Types: Snuff  Substance Use Topics  . Alcohol use: Never    Alcohol/week: 0.0 standard drinks    Frequency: Never  . Drug use: Never      Allergies   Patient has no known allergies.   Review of Systems Review of Systems   Physical Exam Triage Vital Signs ED Triage Vitals  Enc Vitals Group     BP 08/15/18 1308 (!) 141/89     Pulse Rate 08/15/18 1306 61     Resp 08/15/18 1306 18     Temp 08/15/18 1306 98.3 F (36.8 C)     Temp Source 08/15/18 1306 Oral     SpO2 08/15/18 1306 100 %     Weight 08/15/18 1307 165 lb (74.8 kg)     Height 08/15/18 1307 5\' 2"  (1.575 m)     Head Circumference --      Peak Flow --      Pain Score 08/15/18 1307 9     Pain Loc --      Pain Edu? --      Excl. in GC? --    No data found.  Updated Vital Signs BP (!) 141/89 (BP Location: Right Arm)   Pulse 61   Temp 98.3 F (36.8 C) (Oral)   Resp 18   Ht 5\' 2"  (1.575 m)   Wt 74.8 kg   LMP 07/20/2018   SpO2 100%   BMI 30.18 kg/m   Visual  Acuity Right Eye Distance:   Left Eye Distance:   Bilateral Distance:    Right Eye Near:   Left Eye Near:    Bilateral Near:     Physical Exam Vitals signs and nursing note reviewed.  Constitutional:      General: She is not in acute distress.    Appearance: She is not diaphoretic.  Musculoskeletal:     Left hand: She exhibits tenderness (to distal left middle finger), bony tenderness and swelling (to distal left middle finger). She exhibits normal range of motion, normal two-point discrimination, normal capillary refill and no laceration. Normal sensation noted. Normal strength noted.  Neurological:     Mental Status: She is alert.      UC Treatments / Results  Labs (all labs ordered are listed, but only abnormal results are displayed) Labs Reviewed - No data to display  EKG None  Radiology Dg Finger Middle Left  Result Date: 08/15/2018 CLINICAL DATA:  Left third finger injury with pain EXAM: LEFT MIDDLE FINGER 2+V COMPARISON:  None. FINDINGS: There is no evidence of fracture or dislocation. There is no evidence of arthropathy or other focal bone abnormality. Soft tissues are  unremarkable. IMPRESSION: No fracture or dislocation. Electronically Signed   By: Delbert PhenixJason A Poff M.D.   On: 08/15/2018 13:31    Procedures Procedures (including critical care time)  Medications Ordered in UC Medications - No data to display  Initial Impression / Assessment and Plan / UC Course  I have reviewed the triage vital signs and the nursing notes.  Pertinent labs & imaging results that were available during my care of the patient were reviewed by me and considered in my medical decision making (see chart for details).      Final Clinical Impressions(s) / UC Diagnoses   Final diagnoses:  Contusion of left middle finger without damage to nail, initial encounter     Discharge Instructions     Rest, ice, over the counter tylenol and/or advil    ED Prescriptions    Medication Sig Dispense Auth. Provider   HYDROcodone-acetaminophen (NORCO/VICODIN) 5-325 MG tablet 1-2 tabs po bid prn 8 tablet Isley Zinni, Pamala Hurryrlando, MD     1. x-ray results and diagnosis reviewed with patient 2. rx as per orders above; reviewed possible side effects, interactions, risks and benefits  3. Recommend supportive treatment as above  4. Follow-up prn if symptoms worsen or don't improve   Controlled Substance Prescriptions [place] Controlled Substance Registry consulted? Not Applicable   Payton Mccallumonty, Armelia Penton, MD 08/15/18 1349

## 2018-08-15 NOTE — ED Triage Notes (Signed)
Patient c/o left middle finger pain after a couch fell on her finger about 2 hours ago.

## 2018-10-02 ENCOUNTER — Ambulatory Visit (INDEPENDENT_AMBULATORY_CARE_PROVIDER_SITE_OTHER): Payer: Self-pay | Admitting: Nurse Practitioner

## 2018-10-02 VITALS — BP 110/80 | HR 70 | Temp 98.6°F | Resp 18 | Wt 166.6 lb

## 2018-10-02 DIAGNOSIS — N390 Urinary tract infection, site not specified: Secondary | ICD-10-CM

## 2018-10-02 DIAGNOSIS — R300 Dysuria: Secondary | ICD-10-CM

## 2018-10-02 LAB — POC URINALSYSI DIPSTICK (AUTOMATED)
Bilirubin, UA: NEGATIVE
Blood, UA: POSITIVE
Glucose, UA: NEGATIVE
Nitrite, UA: NEGATIVE
Protein, UA: POSITIVE — AB
Spec Grav, UA: >=1.03 — AB (ref 1.010–1.025)
Urobilinogen, UA: 0.2 E.U./dL
pH, UA: 6 (ref 5.0–8.0)

## 2018-10-02 MED ORDER — CEPHALEXIN 500 MG PO CAPS: 500.0000 mg | ORAL_CAPSULE | Freq: Two times a day (BID) | ORAL | 0 refills | Status: AC

## 2018-10-02 MED ORDER — PHENAZOPYRIDINE HCL 100 MG PO TABS: 100.0000 mg | ORAL_TABLET | Freq: Three times a day (TID) | ORAL | 0 refills | Status: AC | PRN

## 2018-10-02 NOTE — Progress Notes (Signed)
Subjective:    Kristine Lara is a 43 y.o. female who complains of burning with urination, frequency, hesitancy, nocturia, suprapubic pressure and urgency for 3 days.  Patient also complains of mild back pain. Patient denies congestion, cough, fever, headache, rhinitis, sorethroat, stomach ache and vaginal discharge.  Patient last UTI was in October, 2019.  Patient informs she had to complete 2 course of abx, as she did not take the first round correctly.  Patient has a history of pylenephritis, but states, "it was a long, long time ago.  Patient also has a history of kidney stones, but states that was several years ago.  The patient is sexually active and informs her symptoms started after recent sexual intercourse.  Denies chance of STD, LMP was at the end of last month, cycles are regular at this time.   The following portions of the patient's history were reviewed and updated as appropriate: allergies, current medications and past medical history. Review of Systems Constitutional: negative Ears, nose, mouth, throat, and face: negative Respiratory: negative Cardiovascular: negative Gastrointestinal: negative Genitourinary:positive for See HPI, negative for sexual problems and vaginal discharge, decreased stream and urinary incontinence Neurological: negative    Objective:    BP 110/80   Pulse 70   Temp 98.6 F (37 C)   Resp 18   Wt 166 lb 9.6 oz (75.6 kg)   SpO2 98%   BMI 30.47 kg/m  Physical Exam Vitals signs reviewed.  Constitutional:      General: She is not in acute distress. HENT:     Head: Normocephalic.     Right Ear: Tympanic membrane, ear canal and external ear normal.     Left Ear: Tympanic membrane, ear canal and external ear normal.     Nose: Nose normal.     Mouth/Throat:     Mouth: Mucous membranes are moist.     Pharynx: No oropharyngeal exudate or posterior oropharyngeal erythema.  Neck:     Musculoskeletal: Normal range of motion and neck supple.   Cardiovascular:     Rate and Rhythm: Normal rate and regular rhythm.     Pulses: Normal pulses.     Heart sounds: Normal heart sounds.  Pulmonary:     Effort: Pulmonary effort is normal. No respiratory distress.     Breath sounds: Normal breath sounds. No stridor. No wheezing, rhonchi or rales.  Abdominal:     General: Bowel sounds are normal.     Palpations: Abdomen is soft.     Tenderness: There is abdominal tenderness in the suprapubic area. There is no right CVA tenderness or left CVA tenderness.  Lymphadenopathy:     Cervical: No cervical adenopathy.  Skin:    General: Skin is warm and dry.     Capillary Refill: Capillary refill takes less than 2 seconds.  Neurological:     General: No focal deficit present.     Mental Status: She is alert and oriented to person, place, and time.  Psychiatric:        Mood and Affect: Mood normal.        Thought Content: Thought content normal.    Results for orders placed or performed in visit on 10/02/18 (from the past 24 hour(s))  POCT Urinalysis Dipstick (Automated)     Status: Abnormal   Collection Time: 10/02/18  1:44 PM  Result Value Ref Range   Color, UA yellow    Clarity, UA clear    Glucose, UA Negative Negative   Bilirubin, UA neg  Ketones, UA trace    Spec Grav, UA >=1.030 (A) 1.010 - 1.025   Blood, UA positive    pH, UA 6.0 5.0 - 8.0   Protein, UA Positive (A) Negative   Urobilinogen, UA 0.2 0.2 or 1.0 E.U./dL   Nitrite, UA neg    Leukocytes, UA Large (3+) (A) Negative    Assessment:    UTI    Plan:   Exam findings, diagnosis etiology and medication use and indications reviewed with patient. Follow- Up and discharge instructions provided. No emergent/urgent issues found on exam. I am going to treat the patient with Keflex and Pyridium.  Did not feel a urine culture was warranted at this time based on information reviewed from [facility] and the timeframe patient had last UTI, which was greater than 6 months ago and  greater than 6 months for the one prior to include previous history of pyelonephritis and kidney stones.  The patient did not display any systemic symptoms of infection to include fever, chills, abdominal pain, and also did not have flank pain or CVA tenderness.  However, I did discuss the indications for recurrent urinary tract infections and explained to patient the need for a culture if symptoms are recurrent, do not resolve, or worsen. Based on patient's physical exam, there is no concern for pyelonephritis at this time. Patient education was provided. Patient verbalized understanding of information provided and agrees with plan of care (POC), all questions answered. The patient is advised to call or return to clinic if condition does not see an improvement in symptoms, or to seek the care of the closest emergency department if condition worsens with the above plan.   1. Burning with urination  - POCT Urinalysis Dipstick (Automated)  2. Acute UTI  - cephALEXin (KEFLEX) 500 MG capsule; Take 1 capsule (500 mg total) by mouth 2 (two) times daily for 7 days.  Dispense: 14 capsule; Refill: 0 - phenazopyridine (PYRIDIUM) 100 MG tablet; Take 1 tablet (100 mg total) by mouth 3 (three) times daily as needed for up to 3 days for pain.  Dispense: 9 tablet; Refill: 0 -Take medication as prescribed. -Ibuprofen or Tylenol for pain, fever, or general discomfort. -Develop a toileting schedule to void at least every 2 hours. -Avoid caffeine, to include tea, soda, and coffee. -Void approximately 15 to 20 minutes after sexual intercourse. -Increase fluids. -Follow-up with your PCP or an urgent care if symptoms do not improve.  At that time you will need a urine culture. -Follow-up in our office as needed.

## 2018-10-02 NOTE — Patient Instructions (Signed)
Urinary Tract Infection, Adult -Take medication as prescribed. -Ibuprofen or Tylenol for pain, fever, or general discomfort. -Develop a toileting schedule to void at least every 2 hours. -Avoid caffeine, to include tea, soda, and coffee. -Void approximately 15 to 20 minutes after sexual intercourse. -Increase fluids. -Follow-up with your PCP or an urgent care if symptoms do not improve.  At that time you will need a urine culture. -Follow-up in our office as needed.   A urinary tract infection (UTI) is an infection of any part of the urinary tract. The urinary tract includes the kidneys, ureters, bladder, and urethra. These organs make, store, and get rid of urine in the body. Your health care provider may use other names to describe the infection. An upper UTI affects the ureters and kidneys (pyelonephritis). A lower UTI affects the bladder (cystitis) and urethra (urethritis). What are the causes? Most urinary tract infections are caused by bacteria in your genital area, around the entrance to your urinary tract (urethra). These bacteria grow and cause inflammation of your urinary tract. What increases the risk? You are more likely to develop this condition if:  You have a urinary catheter that stays in place (indwelling).  You are not able to control when you urinate or have a bowel movement (you have incontinence).  You are female and you: ? Use a spermicide or diaphragm for birth control. ? Have low estrogen levels. ? Are pregnant.  You have certain genes that increase your risk (genetics).  You are sexually active.  You take antibiotic medicines.  You have a condition that causes your flow of urine to slow down, such as: ? An enlarged prostate, if you are female. ? Blockage in your urethra (stricture). ? A kidney stone. ? A nerve condition that affects your bladder control (neurogenic bladder). ? Not getting enough to drink, or not urinating often.  You have certain medical  conditions, such as: ? Diabetes. ? A weak disease-fighting system (immunesystem). ? Sickle cell disease. ? Gout. ? Spinal cord injury. What are the signs or symptoms? Symptoms of this condition include:  Needing to urinate right away (urgently).  Frequent urination or passing small amounts of urine frequently.  Pain or burning with urination.  Blood in the urine.  Urine that smells bad or unusual.  Trouble urinating.  Cloudy urine.  Vaginal discharge, if you are female.  Pain in the abdomen or the lower back. You may also have:  Vomiting or a decreased appetite.  Confusion.  Irritability or tiredness.  A fever.  Diarrhea. The first symptom in older adults may be confusion. In some cases, they may not have any symptoms until the infection has worsened. How is this diagnosed? This condition is diagnosed based on your medical history and a physical exam. You may also have other tests, including:  Urine tests.  Blood tests.  Tests for sexually transmitted infections (STIs). If you have had more than one UTI, a cystoscopy or imaging studies may be done to determine the cause of the infections. How is this treated? Treatment for this condition includes:  Antibiotic medicine.  Over-the-counter medicines to treat discomfort.  Drinking enough water to stay hydrated. If you have frequent infections or have other conditions such as a kidney stone, you may need to see a health care provider who specializes in the urinary tract (urologist). In rare cases, urinary tract infections can cause sepsis. Sepsis is a life-threatening condition that occurs when the body responds to an infection. Sepsis is treated  in the hospital with IV antibiotics, fluids, and other medicines. Follow these instructions at home:  Medicines  Take over-the-counter and prescription medicines only as told by your health care provider.  If you were prescribed an antibiotic medicine, take it as  told by your health care provider. Do not stop using the antibiotic even if you start to feel better. General instructions  Make sure you: ? Empty your bladder often and completely. Do not hold urine for long periods of time. ? Empty your bladder after sex. ? Wipe from front to back after a bowel movement if you are female. Use each tissue one time when you wipe.  Drink enough fluid to keep your urine pale yellow.  Keep all follow-up visits as told by your health care provider. This is important. Contact a health care provider if:  Your symptoms do not get better after 1-2 days.  Your symptoms go away and then return. Get help right away if you have:  Severe pain in your back or your lower abdomen.  A fever.  Nausea or vomiting. Summary  A urinary tract infection (UTI) is an infection of any part of the urinary tract, which includes the kidneys, ureters, bladder, and urethra.  Most urinary tract infections are caused by bacteria in your genital area, around the entrance to your urinary tract (urethra).  Treatment for this condition often includes antibiotic medicines.  If you were prescribed an antibiotic medicine, take it as told by your health care provider. Do not stop using the antibiotic even if you start to feel better.  Keep all follow-up visits as told by your health care provider. This is important. This information is not intended to replace advice given to you by your health care provider. Make sure you discuss any questions you have with your health care provider. Document Released: 01/09/2005 Document Revised: 10/09/2017 Document Reviewed: 10/09/2017 Elsevier Interactive Patient Education  2019 [employer].

## 2018-10-29 DIAGNOSIS — Z01419 Encounter for gynecological examination (general) (routine) without abnormal findings: Secondary | ICD-10-CM | POA: Diagnosis not present

## 2018-10-29 DIAGNOSIS — Z1231 Encounter for screening mammogram for malignant neoplasm of breast: Secondary | ICD-10-CM | POA: Diagnosis not present

## 2018-10-30 ENCOUNTER — Other Ambulatory Visit: Payer: Self-pay | Admitting: Obstetrics and Gynecology

## 2019-01-13 ENCOUNTER — Other Ambulatory Visit: Payer: Self-pay | Admitting: Obstetrics and Gynecology

## 2019-01-13 DIAGNOSIS — Z1231 Encounter for screening mammogram for malignant neoplasm of breast: Secondary | ICD-10-CM

## 2019-01-26 DIAGNOSIS — E663 Overweight: Secondary | ICD-10-CM | POA: Diagnosis not present

## 2019-01-26 DIAGNOSIS — Z0000 Encounter for general adult medical examination without abnormal findings: Secondary | ICD-10-CM | POA: Diagnosis not present

## 2019-01-26 DIAGNOSIS — B369 Superficial mycosis, unspecified: Secondary | ICD-10-CM | POA: Diagnosis not present

## 2019-02-02 DIAGNOSIS — R399 Unspecified symptoms and signs involving the genitourinary system: Secondary | ICD-10-CM | POA: Diagnosis not present

## 2019-02-02 DIAGNOSIS — N390 Urinary tract infection, site not specified: Secondary | ICD-10-CM | POA: Diagnosis not present

## 2019-03-09 ENCOUNTER — Ambulatory Visit
Admission: RE | Admit: 2019-03-09 | Discharge: 2019-03-09 | Disposition: A | Discharge status: Home / Self Care | Payer: 59 | Source: Ambulatory Visit | Attending: Obstetrics and Gynecology | Admitting: Obstetrics and Gynecology

## 2019-03-09 ENCOUNTER — Other Ambulatory Visit: Payer: Self-pay

## 2019-03-09 DIAGNOSIS — Z1231 Encounter for screening mammogram for malignant neoplasm of breast: Secondary | ICD-10-CM | POA: Insufficient documentation

## 2019-11-02 ENCOUNTER — Other Ambulatory Visit: Payer: Self-pay | Admitting: Obstetrics and Gynecology

## 2019-11-02 DIAGNOSIS — Z01419 Encounter for gynecological examination (general) (routine) without abnormal findings: Secondary | ICD-10-CM | POA: Diagnosis not present

## 2019-11-02 DIAGNOSIS — Z1231 Encounter for screening mammogram for malignant neoplasm of breast: Secondary | ICD-10-CM

## 2019-11-02 DIAGNOSIS — R87610 Atypical squamous cells of undetermined significance on cytologic smear of cervix (ASC-US): Secondary | ICD-10-CM | POA: Diagnosis not present

## 2020-02-15 ENCOUNTER — Other Ambulatory Visit: Payer: Self-pay

## 2020-02-15 ENCOUNTER — Ambulatory Visit
Admission: EM | Admit: 2020-02-15 | Discharge: 2020-02-15 | Disposition: A | Discharge status: Home / Self Care | Payer: 59 | Attending: Emergency Medicine | Admitting: Emergency Medicine

## 2020-02-15 ENCOUNTER — Encounter: Payer: Self-pay | Admitting: Emergency Medicine

## 2020-02-15 DIAGNOSIS — J069 Acute upper respiratory infection, unspecified: Secondary | ICD-10-CM | POA: Diagnosis not present

## 2020-02-15 DIAGNOSIS — Z20822 Contact with and (suspected) exposure to covid-19: Secondary | ICD-10-CM | POA: Insufficient documentation

## 2020-02-15 LAB — GROUP A STREP BY PCR: Group A Strep by PCR: NOT DETECTED

## 2020-02-15 NOTE — ED Provider Notes (Signed)
[facility]    CSN: [id number] Arrival date & time: 02/15/20  1440      History   Chief Complaint Chief Complaint  Patient presents with   Sore Throat    HPI Kristine Lara is a 44 y.o. female.   44 year old female here for evaluation of sore throat.  Patient reports she has had associated chills, runny nose with green nasal discharge, cough, shortness of breath, and changes to sense of taste or smell.  She has not had a fever, body aches, or sick contacts that she is aware of.  Patient has been vaccinate against Covid but has not received her booster yet.  Symptoms started 1 week ago.     History reviewed. No pertinent past medical history.  Patient Active Problem List   Diagnosis Date Noted   Obesity (BMI 30-39.9) 07/15/2018    History reviewed. No pertinent surgical history.  OB History   No obstetric history on file.      Home Medications    Prior to Admission medications   Medication Sig Start Date End Date Taking? Authorizing Provider  levocetirizine (XYZAL) 5 MG tablet Take 1 tablet (5 mg total) by mouth every evening for 7 days. 07/15/18 07/22/18  [name], PA-C  fexofenadine (ALLEGRA) 180 MG tablet Take 1 tablet (180 mg total) by mouth daily. Patient not taking: Reported on 10/02/2018 08/11/15 02/15/20  Faythe Ghee, PA-C  fluticasone [facility]) 50 MCG/ACT nasal spray Place 2 sprays into both nostrils daily. Patient not taking: Reported on 10/02/2018 08/11/15 02/15/20  Faythe Ghee, PA-C    Family History Family History  Problem Relation Age of Onset   Breast cancer Neg Hx     Social History Social History   Tobacco Use   Smoking status: Never Smoker   Smokeless tobacco: Former Neurosurgeon    Types: Snuff  Vaping Use   Vaping Use: Never used  Substance Use Topics   Alcohol use: Never    Alcohol/week: 0.0 standard drinks   Drug use: Never     Allergies   Patient has no known allergies.   Review of Systems Review of  Systems  Constitutional: Negative for activity change, appetite change and fever.  HENT: Positive for congestion, postnasal drip, rhinorrhea and sore throat. Negative for ear discharge, ear pain, sinus pressure and sinus pain.   Respiratory: Positive for cough and shortness of breath. Negative for wheezing.   Cardiovascular: Negative for chest pain.  Gastrointestinal: Negative for diarrhea, nausea and vomiting.  Musculoskeletal: Negative for arthralgias and myalgias.  Skin: Negative for rash.  Neurological: Negative for syncope and headaches.  Hematological: Negative.   Psychiatric/Behavioral: Negative.      Physical Exam Triage Vital Signs ED Triage Vitals  Enc Vitals Group     BP 02/15/20 1507 (!) 150/73     Pulse Rate 02/15/20 1507 64     Resp 02/15/20 1507 18     Temp 02/15/20 1507 98.4 F (36.9 C)     Temp Source 02/15/20 1507 Oral     SpO2 02/15/20 1507 100 %     Weight 02/15/20 1504 155 lb (70.3 kg)     Height 02/15/20 1504 5\' 2"  (1.575 m)     Head Circumference --      Peak Flow --      Pain Score 02/15/20 1504 6     Pain Loc --      Pain Edu? --      Excl. in GC? --  No data found.  Updated Vital Signs BP (!) 150/73 (BP Location: Right Arm)    Pulse 64    Temp 98.4 F (36.9 C) (Oral)    Resp 18    Ht 5\' 2"  (1.575 m)    Wt 155 lb (70.3 kg)    LMP 02/02/2020    SpO2 100%    BMI 28.35 kg/m   Visual Acuity Right Eye Distance:   Left Eye Distance:   Bilateral Distance:    Right Eye Near:   Left Eye Near:    Bilateral Near:     Physical Exam Vitals and nursing note reviewed.  Constitutional:      General: She is not in acute distress.    Appearance: She is well-developed and normal weight. She is not toxic-appearing.  HENT:     Head: Normocephalic and atraumatic.     Right Ear: Tympanic membrane and ear canal normal. No middle ear effusion. Tympanic membrane is not erythematous.     Left Ear: Tympanic membrane and ear canal normal.  No middle ear  effusion. Tympanic membrane is not erythematous.     Nose: Congestion and rhinorrhea present.     Comments: Mucosa is mildly edematous with clear nasal discharge.  No erythema noted.  No tenderness to frontal or maxillary sinuses with percussion.    Mouth/Throat:     Mouth: Mucous membranes are moist.     Pharynx: Oropharynx is clear. Posterior oropharyngeal erythema present. No pharyngeal swelling or oropharyngeal exudate.     Tonsils: No tonsillar exudate or tonsillar abscesses. 0 on the right. 0 on the left.     Comments: Posterior oropharynx has mild erythema with clear postnasal drip.  No injection or exudate noted.  Tonsillar pillars are unremarkable. Eyes:     Conjunctiva/sclera: Conjunctivae normal.     Pupils: Pupils are equal, round, and reactive to light.  Cardiovascular:     Rate and Rhythm: Normal rate and regular rhythm.     Heart sounds: Normal heart sounds. No murmur heard.  No gallop.   Pulmonary:     Effort: Pulmonary effort is normal.     Breath sounds: Normal breath sounds. No wheezing, rhonchi or rales.  Musculoskeletal:     Cervical back: Normal range of motion and neck supple.  Lymphadenopathy:     Cervical: No cervical adenopathy.  Skin:    General: Skin is warm.     Capillary Refill: Capillary refill takes less than 2 seconds.     Findings: No erythema or rash.  Neurological:     General: No focal deficit present.     Mental Status: She is alert and oriented to person, place, and time.  Psychiatric:        Mood and Affect: Mood normal.        Behavior: Behavior normal.      UC Treatments / Results  Labs (all labs ordered are listed, but only abnormal results are displayed) Labs Reviewed  GROUP A STREP BY PCR  SARS CORONAVIRUS 2 (TAT 6-24 HRS)    EKG   Radiology No results found.  Procedures Procedures (including critical care time)  Medications Ordered in UC Medications - No data to display  Initial Impression / Assessment and Plan /  UC Course  I have reviewed the triage vital signs and the nursing notes.  Pertinent labs & imaging results that were available during my care of the patient were reviewed by me and considered in my medical decision making (see chart  for details).   Is here for evaluation of cold symptoms that she has had for a week the most predominant of which is a sore throat.  Patient is not had fever or sick contacts.  She has been vaccinating his Covid with the full vaccine series but not a booster yet.  Patient has nasal mucosal edema but no erythema or injection with clear nasal discharge.  No sinus tenderness to percussion.  Some postnasal drip as well.  We will check strep PCR and Covid.  PCR is negative and Covid is pending.  Will DC home with diagnosis of viral URI and pharyngitis.  Will have patient isolate while Covid results are outstanding.  Will treat with supportive therapy and give precautions.   Final Clinical Impressions(s) / UC Diagnoses   Final diagnoses:  Viral upper respiratory tract infection     Discharge Instructions     Isolate at home until the results of your Covid test are back.  If your test is positive you will need to quarantine for 10 days from the start of your symptoms.  After the 10 days you can break quarantine if your symptoms have improved and you have not had a fever for 24 hours.  Use Tylenol and ibuprofen as needed for fever and pain.  Gargle with warm salt water 2-3 times a day.  Your symptoms worsen follow-up with your PCP.    ED Prescriptions    None     PDMP not reviewed this encounter.   Becky Augusta, NP 02/15/20 [phone]

## 2020-02-15 NOTE — ED Triage Notes (Signed)
Patient c/o sore throat that started last week. Denies fever. Denies any other symptoms.

## 2020-02-15 NOTE — Discharge Instructions (Signed)
Isolate at home until the results of your Covid test are back.  If your test is positive you will need to quarantine for 10 days from the start of your symptoms.  After the 10 days you can break quarantine if your symptoms have improved and you have not had a fever for 24 hours.  Use Tylenol and ibuprofen as needed for fever and pain.  Gargle with warm salt water 2-3 times a day.  Your symptoms worsen follow-up with your PCP.

## 2020-02-16 LAB — SARS CORONAVIRUS 2 (TAT 6-24 HRS): SARS Coronavirus 2: NEGATIVE

## 2020-03-13 ENCOUNTER — Other Ambulatory Visit: Payer: Self-pay

## 2020-03-13 ENCOUNTER — Ambulatory Visit
Admission: RE | Admit: 2020-03-13 | Discharge: 2020-03-13 | Disposition: A | Discharge status: Home / Self Care | Payer: 59 | Source: Ambulatory Visit | Attending: Obstetrics and Gynecology | Admitting: Obstetrics and Gynecology

## 2020-03-13 DIAGNOSIS — Z1231 Encounter for screening mammogram for malignant neoplasm of breast: Secondary | ICD-10-CM | POA: Insufficient documentation

## 2020-04-28 DIAGNOSIS — K130 Diseases of lips: Secondary | ICD-10-CM | POA: Diagnosis not present

## 2020-04-28 DIAGNOSIS — Z131 Encounter for screening for diabetes mellitus: Secondary | ICD-10-CM | POA: Diagnosis not present

## 2020-04-28 DIAGNOSIS — D649 Anemia, unspecified: Secondary | ICD-10-CM | POA: Diagnosis not present

## 2020-04-28 DIAGNOSIS — Z1321 Encounter for screening for nutritional disorder: Secondary | ICD-10-CM | POA: Diagnosis not present

## 2020-04-28 DIAGNOSIS — Z1159 Encounter for screening for other viral diseases: Secondary | ICD-10-CM | POA: Diagnosis not present

## 2020-04-28 DIAGNOSIS — Z13220 Encounter for screening for lipoid disorders: Secondary | ICD-10-CM | POA: Diagnosis not present

## 2020-04-28 DIAGNOSIS — K1379 Other lesions of oral mucosa: Secondary | ICD-10-CM | POA: Diagnosis not present

## 2020-05-08 DIAGNOSIS — Z20822 Contact with and (suspected) exposure to covid-19: Secondary | ICD-10-CM | POA: Diagnosis not present

## 2020-05-15 DIAGNOSIS — D509 Iron deficiency anemia, unspecified: Secondary | ICD-10-CM | POA: Diagnosis not present

## 2020-05-15 DIAGNOSIS — N921 Excessive and frequent menstruation with irregular cycle: Secondary | ICD-10-CM | POA: Diagnosis not present

## 2020-05-15 DIAGNOSIS — Z0000 Encounter for general adult medical examination without abnormal findings: Secondary | ICD-10-CM | POA: Diagnosis not present

## 2020-05-22 DIAGNOSIS — N920 Excessive and frequent menstruation with regular cycle: Secondary | ICD-10-CM | POA: Diagnosis not present

## 2020-05-22 DIAGNOSIS — D509 Iron deficiency anemia, unspecified: Secondary | ICD-10-CM | POA: Diagnosis not present

## 2020-06-12 NOTE — Progress Notes (Signed)
[facility]  [address], [address] [place], [place] [address] Phone: [phone]  Fax: [phone]   Clinic Day:  06/13/2020  Referring physician: Myrene Buddy, *  Chief Complaint: Kristine Lara is a 45 y.o. female with iron deficiency anemia who is referred in consultation by Lenon Oms, NP for assessment and management.   HPI: The patient saw Lenon Oms, NP on 04/28/2020.  She noted mouth sores and oral pain since 02/2020.  Hematocrit was 23.7, hemoglobin 6.4, MCV 61.9, platelets 512,000, WBC 5,200. Iron saturation was <2% and TIBC 409.5. She was referred to hematology for IV iron.  The patient saw Lenon Oms, NP on 05/15/2020. She reported fatigue and lack of motivation to go to the gym. She also reported heavy periods with clots that occurred twice a month since about 12/2019. She had started taking oral iron BID. She was provided a handout about following an iron rich diet. She was advised to schedule an appointment with her OBGYN to discuss options for menorrhagia.   The patient had a transvaginal pelvic ultrasound on 05/22/2020 at [facility]. Results are not available.  The patient has never been on hormonal birth control except for a short trial of Depo.  Labs followed: 10/03/2015: Hematocrit 36.5, hemoglobin 12.2, MCV 90.8, platelets 306,000, WBC 6,200. 10/03/2016: Hematocrit 34.9, hemoglobin 12.1, MCV 89.9, platelets 290,000, WBC 4,700. 10/21/2017: Hematocrit 35.8, hemoglobin 11.9, MCV 91.1, platelets 326,000, WBC 7,100. 04/28/2020: Hematocrit 23.7, hemoglobin   6.4, MCV 61.9, platelets 512,000, WBC 5,200.  Additional labs on 04/28/2020 included a normal CMP, TSH of 0.612 (normal), vitamin D, 25-hydroxy 27.8 (30 - 100).  Symptomatically, she feels "good." In 04/2020, when her hemoglobin was low, she was very tired and could not be as active. Her energy level is good right now. She bruises easily and rarely takes aspirin or ibuprofen. She  took Excedrin last week. She gets headaches and shakes when she misses meals.  She had numbness in her foot but it resolved on it's own. She had mouth sores and decreased taste in 04/2020 but these symptoms went away with mouthwash from Lenon Oms, NP.  She denies fevers, sweats, headaches, changes in vision, runny nose, sore throat, cough, shortness of breath, chest pain, palpitations, nausea, vomiting, diarrhea, reflux, urinary symptoms, bone or joint symptoms, skin changes, numbness, weakness, and balance or coordination problems.  The patient's diet is good. She does not eat a lot of meat. She eats a lot of vegetables and grains. She has been on oral iron for "forever." She takes one iron pill per day with an orange. She tried to take it BID but it made her stools hard.  The patient has been having two periods per month since about 4 months ago. Menses last 3 days. One the first day, she has to change her tampons frequently. She gets a bit dizzy, but it is not very bad. The periods she has now are the same length as they were when she had them once per month. She was anemic before her periods increased in frequency. Besides her periods, she denies any other bleeding.  The patient has never had a colonoscopy or EGD. She has had three teeth extracted but did not have any excess bleeding.  Her sisters and her mother are anemic but have not been treated. She denies a family history of cancer.    History reviewed. No pertinent past medical history.  History reviewed. No pertinent surgical history.  Family History  Problem Relation  Age of Onset  . Breast cancer Neg Hx     Social History:  reports that she has never smoked. She has quit using smokeless tobacco.  Her smokeless tobacco use included snuff. She reports that she does not drink alcohol and does not use drugs. She denies alcohol or tobacco use. She denies exposure to radiation or toxins. 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 She likes to stay active and go to the gym. The patient is alone today.  Allergies: No Known Allergies  Current Medications: Current Outpatient Medications  Medication Sig Dispense Refill  . levocetirizine (XYZAL) 5 MG tablet Take 1 tablet (5 mg total) by mouth every evening for 7 days. 7 tablet 0   No current facility-administered medications for this visit.    Review of Systems  Constitutional: Negative for chills, diaphoresis, fever, malaise/fatigue and weight loss.       Feels "good."   HENT: Negative for congestion, ear discharge, ear pain, hearing loss, nosebleeds, sinus pain, sore throat and tinnitus.   Eyes: Negative for blurred vision.  Respiratory: Negative for cough, hemoptysis, sputum production and shortness of breath.   Cardiovascular: Negative for chest pain, palpitations and leg swelling.  Gastrointestinal: Negative for abdominal pain, blood in stool, constipation, diarrhea, heartburn, melena, nausea and vomiting.  Genitourinary: Negative for dysuria, frequency, hematuria and urgency.       Two periods per month.  Musculoskeletal: Negative for back pain, joint pain, myalgias and neck pain.  Skin: Negative for itching and rash.  Neurological: Positive for headaches (if she misses a meal). Negative for dizziness (with periods, mild, occasional), tingling, sensory change and weakness.  Endo/Heme/Allergies: Bruises/bleeds easily (bruising).  Psychiatric/Behavioral: Negative for depression and memory loss. The patient is not nervous/anxious and does not have insomnia.   All other systems reviewed and are negative.  Performance status (ECOG): 0  Vitals Blood pressure 126/64, pulse (!) 55, temperature (!) 97.4 F (36.3 C), temperature source Tympanic, resp. rate 16, weight 154 lb 1.6 oz (69.9 kg), SpO2 100 %.   Physical Exam Vitals and nursing note reviewed.  Constitutional:      General: She  is not in acute distress.    Appearance: She is not diaphoretic.  HENT:     Head: Normocephalic and atraumatic.     Mouth/Throat:     Mouth: Mucous membranes are moist.     Pharynx: Oropharynx is clear.  Eyes:     General: No scleral icterus.    Extraocular Movements: Extraocular movements intact.     Conjunctiva/sclera: Conjunctivae normal.     Pupils: Pupils are equal, round, and reactive to light.  Cardiovascular:     Rate and Rhythm: Normal rate and regular rhythm.     Heart sounds: Normal heart sounds. No murmur heard.   Pulmonary:     Effort: Pulmonary effort is normal. No respiratory distress.     Breath sounds: Normal breath sounds. No wheezing or rales.  Chest:     Chest wall: No tenderness.  Breasts:     Right: No axillary adenopathy or supraclavicular adenopathy.     Left: No axillary adenopathy or supraclavicular adenopathy.    Abdominal:     General: Bowel sounds are normal. There is no distension.     Palpations: Abdomen is soft. There is no mass.     Tenderness: There is no abdominal tenderness. There  is no guarding or rebound.  Musculoskeletal:        General: No swelling or tenderness. Normal range of motion.     Cervical back: Normal range of motion and neck supple.  Lymphadenopathy:     Head:     Right side of head: No preauricular, posterior auricular or occipital adenopathy.     Left side of head: No preauricular, posterior auricular or occipital adenopathy.     Cervical: No cervical adenopathy.     Upper Body:     Right upper body: No supraclavicular or axillary adenopathy.     Left upper body: No supraclavicular or axillary adenopathy.     Lower Body: No right inguinal adenopathy. No left inguinal adenopathy.  Skin:    General: Skin is warm and dry.  Neurological:     Mental Status: She is alert and oriented to person, place, and time.  Psychiatric:        Behavior: Behavior normal.        Thought Content: Thought content normal.         Judgment: Judgment normal.    No visits with results within 3 Day(s) from this visit.  Latest known visit with results is:  Admission on 02/15/2020, Discharged on 02/15/2020  Component Date Value Ref Range Status  . Group A Strep by PCR 02/15/2020 NOT DETECTED  NOT DETECTED Final   Performed at [facility], [address], [place], [place] [address]  . SARS Coronavirus 2 02/15/2020 NEGATIVE  NEGATIVE Final   Comment: (NOTE) SARS-CoV-2 target nucleic acids are NOT DETECTED.  The SARS-CoV-2 RNA is generally detectable in upper and lower respiratory specimens during the acute phase of infection. Negative results do not preclude SARS-CoV-2 infection, do not rule out co-infections with other pathogens, and should not be used as the sole basis for treatment or other patient management decisions. Negative results must be combined with clinical observations, patient history, and epidemiological information. The expected result is Negative.  Fact Sheet for Patients: HairSlick.no  Fact Sheet for Healthcare Providers: quierodirigir.com  This test is not yet approved or cleared by the [place] FDA and  has been authorized for detection and/or diagnosis of SARS-CoV-2 by FDA under an Emergency Use Authorization (EUA). This EUA will remain  in effect (meaning this test can be used) for the duration of the COVID-19 declaration under Se                          ction 564(b)(1) of the Act, 21 U.S.C. section 360bbb-3(b)(1), unless the authorization is terminated or revoked sooner.  Performed at [facility], [address], [place], [place] [address]     Assessment:  Kristine Lara is a 45 y.o. female with iron deficiency anemia.  She notes menorrhagia.  She is on oral iron daily.  Diet is good.  Menses has been twice monthly x 4 months.  She denies any other bleeding.  Labs on 04/28/2020 revealed a hematocrit  was 23.7, hemoglobin 6.4, MCV 61.9, platelets 512,000, WBC 5,200. Iron saturation was <2% and TIBC 409.5.  Normal labs included:  CMP and TSH of 0.612 (normal).  Vitamin D, 25-hydroxy was 27.8 (30-100).  Transvaginal pelvic ultrasound on 05/22/2020 at [facility]. Results are not available.  She has never been on hormonal birth control except for a short trial of Depo.  She has never had a colonoscopy or EGD.  She has had 3 teeth extracted  and did not have any excess bleeding.  The patient received the Moderna COVID-19 vaccine on 07/02/2019 and 07/30/2019. She received the flu shot on 01/14/2020.  Symptomatically, she energy level has improved since 04/2020.  She bruises easily and rarely takes aspirin or ibuprofen.  Exam is unremarkable.  Plan: 1.   Labs today:  CBC with diff, ferritin, iron studies, hold tube. 2.   Iron deficiency anemia  Diet appears good.  She takes oral iron daily (twice daily results in constipation).  She has increased frequency of menses although anemia appears to have predated increased menses.  Discuss consideration of IV iron.   Information provided.  3.   Menorrhagia  Patient has increased frequency of menses.  She bruises easily.  She rarely takes aspirin or ibuprofen.  She denies any other excess bleeding.  Consider bleeding diathesis work-up (PT, PTT, von Willebrand panel, PFA). 4.   Preauth Venofer. 5.   Patient would like to wait until labs back regarding initiation of treatment. 6.   RTC after preauth for 1st infusion of Venofer and urine pregnancy test. 7.   Anticipate Venofer weekly x 3-4 based on labs today. 8.   RTC in 2 weeks (when she would typically have an infusion) for MD assessment, review of initial labs and follow-up labs (CBC).  I discussed the assessment and treatment plan with the patient.  The patient was provided an opportunity to ask questions and all were answered.  The patient agreed with the plan and demonstrated an understanding of the  instructions.  The patient was advised to call back if the symptoms worsen or if the condition fails to improve as anticipated.  I provided 26 minutes of face-to-face time during this this encounter and > 50% was spent counseling as documented under my assessment and plan. An additional 10+ minutes were spent reviewing her chart (Epic and Care Everywhere) including notes, labs, and imaging studies.    Kashayla Ungerer C. Merlene Pulling, MD, PhD    06/13/2020, 3:34 PM  I, Danella Penton Tufford, am acting as Neurosurgeon for [employer]. Merlene Pulling, MD, PhD.  I, Mannat Benedetti C. Merlene Pulling, MD, have reviewed the above documentation for accuracy and completeness, and I agree with the above.

## 2020-06-13 ENCOUNTER — Inpatient Hospital Stay: Payer: 59

## 2020-06-13 ENCOUNTER — Encounter: Payer: Self-pay | Admitting: Hematology and Oncology

## 2020-06-13 ENCOUNTER — Other Ambulatory Visit: Payer: Self-pay

## 2020-06-13 ENCOUNTER — Inpatient Hospital Stay: Payer: 59 | Attending: Hematology and Oncology | Admitting: Hematology and Oncology

## 2020-06-13 VITALS — BP 126/64 | HR 55 | Temp 97.4°F | Resp 16 | Wt 154.1 lb

## 2020-06-13 DIAGNOSIS — N920 Excessive and frequent menstruation with regular cycle: Secondary | ICD-10-CM | POA: Insufficient documentation

## 2020-06-13 DIAGNOSIS — D500 Iron deficiency anemia secondary to blood loss (chronic): Secondary | ICD-10-CM

## 2020-06-13 DIAGNOSIS — N921 Excessive and frequent menstruation with irregular cycle: Secondary | ICD-10-CM

## 2020-06-13 LAB — CBC WITH DIFFERENTIAL/PLATELET
Abs Immature Granulocytes: 0.02 10*3/uL (ref 0.00–0.07)
Basophils Absolute: 0 10*3/uL (ref 0.0–0.1)
Basophils Relative: 1 %
Eosinophils Absolute: 0.1 10*3/uL (ref 0.0–0.5)
Eosinophils Relative: 2 %
HCT: 31.3 % — ABNORMAL LOW (ref 36.0–46.0)
Hemoglobin: 9 g/dL — ABNORMAL LOW (ref 12.0–15.0)
Immature Granulocytes: 0 %
Lymphocytes Relative: 33 %
Lymphs Abs: 1.9 10*3/uL (ref 0.7–4.0)
MCH: 20.9 pg — ABNORMAL LOW (ref 26.0–34.0)
MCHC: 28.8 g/dL — ABNORMAL LOW (ref 30.0–36.0)
MCV: 72.8 fL — ABNORMAL LOW (ref 80.0–100.0)
Monocytes Absolute: 0.4 10*3/uL (ref 0.1–1.0)
Monocytes Relative: 7 %
Neutro Abs: 3.3 10*3/uL (ref 1.7–7.7)
Neutrophils Relative %: 57 %
Platelets: 463 10*3/uL — ABNORMAL HIGH (ref 150–400)
RBC: 4.3 MIL/uL (ref 3.87–5.11)
RDW: 33 % — ABNORMAL HIGH (ref 11.5–15.5)
WBC: 5.8 10*3/uL (ref 4.0–10.5)
nRBC: 0 % (ref 0.0–0.2)

## 2020-06-13 LAB — SAMPLE TO BLOOD BANK

## 2020-06-13 LAB — APTT: aPTT: 30 seconds (ref 24–36)

## 2020-06-13 LAB — FERRITIN: Ferritin: 9 ng/mL — ABNORMAL LOW (ref 11–307)

## 2020-06-13 LAB — IRON AND TIBC
Iron: 21 ug/dL — ABNORMAL LOW (ref 28–170)
Saturation Ratios: 7 % — ABNORMAL LOW (ref 10.4–31.8)
TIBC: 322 ug/dL (ref 250–450)
UIBC: 301 ug/dL

## 2020-06-13 LAB — PROTIME-INR
INR: 0.9 (ref 0.8–1.2)
Prothrombin Time: 12.2 seconds (ref 11.4–15.2)

## 2020-06-13 NOTE — Patient Instructions (Signed)

## 2020-07-03 ENCOUNTER — Telehealth: Payer: Self-pay | Admitting: *Deleted

## 2020-07-03 DIAGNOSIS — N921 Excessive and frequent menstruation with irregular cycle: Secondary | ICD-10-CM | POA: Insufficient documentation

## 2020-07-03 NOTE — Telephone Encounter (Signed)
Called and spoke to pt. To see if she would like to get venofer infusions. The hgb, ferritin was low and it indicated that she would need IV venofer. She asked does it have side effects and I said that a lot of people have with no problems. There are a few that can get temporary swelling at site of infusion and usually goes away 24 hours, can get rash, HA. If pt. Has reaction then we will stop the infusion and monitor pt or give meds to help with meds for the reaction pt's has. She wanted to know how many and it is usually up to 5 treatments and she wanted to see if insurance approved this. I told her that if she woul dlike to try it , then I will send message for authorization and let pt know and she wants me to call her back with answer

## 2020-07-12 ENCOUNTER — Inpatient Hospital Stay: Payer: 59

## 2020-07-12 ENCOUNTER — Other Ambulatory Visit: Payer: Self-pay

## 2020-07-12 VITALS — BP 125/80 | HR 61 | Temp 97.0°F | Resp 18

## 2020-07-12 DIAGNOSIS — D500 Iron deficiency anemia secondary to blood loss (chronic): Secondary | ICD-10-CM | POA: Diagnosis not present

## 2020-07-12 DIAGNOSIS — N920 Excessive and frequent menstruation with regular cycle: Secondary | ICD-10-CM | POA: Diagnosis not present

## 2020-07-12 LAB — PREGNANCY, URINE: Preg Test, Ur: NEGATIVE

## 2020-07-12 MED ORDER — SODIUM CHLORIDE 0.9 % IV SOLN: 200.0000 mg | Freq: Once | INTRAVENOUS | Status: DC

## 2020-07-12 MED ORDER — IRON SUCROSE 20 MG/ML IV SOLN
200.0000 mg | Freq: Once | INTRAVENOUS | Status: AC
  Administered 2020-07-12: 200 mg via INTRAVENOUS
  Filled 2020-07-12: qty 10

## 2020-07-12 MED ORDER — SODIUM CHLORIDE 0.9 % IV SOLN
Freq: Once | INTRAVENOUS | Status: AC
  Filled 2020-07-12: qty 250

## 2020-07-17 ENCOUNTER — Other Ambulatory Visit: Payer: Self-pay

## 2020-07-17 ENCOUNTER — Inpatient Hospital Stay: Payer: 59 | Attending: Hematology and Oncology

## 2020-07-17 VITALS — BP 134/81 | HR 64 | Temp 96.4°F | Resp 20

## 2020-07-17 DIAGNOSIS — D508 Other iron deficiency anemias: Secondary | ICD-10-CM | POA: Diagnosis not present

## 2020-07-17 DIAGNOSIS — D500 Iron deficiency anemia secondary to blood loss (chronic): Secondary | ICD-10-CM

## 2020-07-17 MED ORDER — SODIUM CHLORIDE 0.9 % IV SOLN: 200.0000 mg | Freq: Once | INTRAVENOUS | Status: DC

## 2020-07-17 MED ORDER — IRON SUCROSE 20 MG/ML IV SOLN
200.0000 mg | Freq: Once | INTRAVENOUS | Status: AC
  Administered 2020-07-17: 200 mg via INTRAVENOUS
  Filled 2020-07-17: qty 10

## 2020-07-17 MED ORDER — SODIUM CHLORIDE 0.9 % IV SOLN
Freq: Once | INTRAVENOUS | Status: AC
  Filled 2020-07-17: qty 250

## 2020-07-17 NOTE — Progress Notes (Signed)
Pt tolerated venofer infusion well today with no problems or complaints. Pt left infusion suite stable and ambulatory.  

## 2020-07-18 NOTE — Progress Notes (Signed)
[facility]  [address] [place], [place] [address] Phone: [phone]  Fax: [phone]   Clinic Day:  07/19/2020  Referring physician: Mickey Farber, MD  Chief Complaint: Kristine Lara is a 45 y.o. female with iron deficiency anemia who seen for 1 month assessment and continuation of Venofer.  HPI: The patient was last seen in the hematology clinic on 06/13/2020 for new patient assessment. At that time, her energy level had improved since 04/2020. She bruised easily and rarely took aspirin or ibuprofen.  Exam was unremarkable.  Work-up revealed a hematocrit of 31.3, hemoglobin 9.0, MCV 72.8, platelets 463,000, WBC 5,800. Ferritin was 9 with an iron saturation of 7% and a TIBC of 322. PT/INR was 12.2/0.9 and PTT 30.   She received Venofer on 07/12/2020 and 07/17/2020.  During the interim, she has been "good." She tolerated Venofer well. She feels less tired. She denies shortness of breath, chest pain, palpitations, and dizziness. Her headaches have resolved.   Her last menses started on 07/03/2020.   History reviewed. No pertinent past medical history.  History reviewed. No pertinent surgical history.  Family History  Problem Relation Age of Onset  . Breast cancer Neg Hx     Social History:  reports that she has never smoked. She has quit using smokeless tobacco.  Her smokeless tobacco use included snuff. She reports that she does not drink alcohol and does not use drugs. She denies alcohol or tobacco use. She denies exposure to radiation or toxins. 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 She likes to stay active and go to the gym. The patient is alone today.  Allergies: No Known Allergies  Current Medications: Current Outpatient Medications  Medication Sig Dispense Refill  . levocetirizine (XYZAL) 5 MG tablet Take 1 tablet (5 mg total) by mouth every  evening for 7 days. 7 tablet 0   No current facility-administered medications for this visit.   Facility-Administered Medications Ordered in Other Visits  Medication Dose Route Frequency Provider Last Rate Last Admin  . iron sucrose (VENOFER) injection 200 mg  200 mg Intravenous Once Rosey Bath, MD        Review of Systems  Constitutional: Negative for chills, diaphoresis, fever, malaise/fatigue and weight loss (up 3 lbs).       Feels "good." [place] has improved.  HENT: Negative for congestion, ear discharge, ear pain, hearing loss, nosebleeds, sinus pain, sore throat and tinnitus.   Eyes: Negative for blurred vision.  Respiratory: Negative for cough, hemoptysis, sputum production and shortness of breath.   Cardiovascular: Negative for chest pain, palpitations and leg swelling.  Gastrointestinal: Negative for abdominal pain, blood in stool, constipation, diarrhea, heartburn, melena, nausea and vomiting.  Genitourinary: Negative for dysuria, frequency, hematuria and urgency.  Musculoskeletal: Negative for back pain, joint pain, myalgias and neck pain.  Skin: Negative for itching and rash.  Neurological: Negative for dizziness, tingling, sensory change, weakness and headaches.  Endo/Heme/Allergies: Does not bruise/bleed easily.  Psychiatric/Behavioral: Negative for depression and memory loss. The patient is not nervous/anxious and does not have insomnia.   All other systems reviewed and are negative.  Performance status (ECOG): 0  Vitals Blood pressure 128/85, pulse 64, temperature 98 F (36.7 C), resp. rate 18, weight 157 lb 3 oz (71.3 kg).   Physical Exam Vitals and nursing note reviewed.  Constitutional:  General: She is not in acute distress.    Appearance: She is not diaphoretic.  HENT:     Head: Normocephalic and atraumatic.     Mouth/Throat:     Mouth: Mucous membranes are moist.     Pharynx: Oropharynx is clear.  Eyes:     General: No scleral icterus.     Extraocular Movements: Extraocular movements intact.     Conjunctiva/sclera: Conjunctivae normal.     Pupils: Pupils are equal, round, and reactive to light.  Cardiovascular:     Rate and Rhythm: Normal rate and regular rhythm.     Heart sounds: Normal heart sounds. No murmur heard.   Pulmonary:     Effort: Pulmonary effort is normal. No respiratory distress.     Breath sounds: Normal breath sounds. No wheezing or rales.  Chest:     Chest wall: No tenderness.  Breasts:     Right: No axillary adenopathy or supraclavicular adenopathy.     Left: No axillary adenopathy or supraclavicular adenopathy.    Abdominal:     General: Bowel sounds are normal. There is no distension.     Palpations: Abdomen is soft. There is no mass.     Tenderness: There is no abdominal tenderness. There is no guarding or rebound.  Musculoskeletal:        General: No swelling or tenderness. Normal range of motion.     Cervical back: Normal range of motion and neck supple.  Lymphadenopathy:     Head:     Right side of head: No preauricular, posterior auricular or occipital adenopathy.     Left side of head: No preauricular, posterior auricular or occipital adenopathy.     Cervical: No cervical adenopathy.     Upper Body:     Right upper body: No supraclavicular or axillary adenopathy.     Left upper body: No supraclavicular or axillary adenopathy.     Lower Body: No right inguinal adenopathy. No left inguinal adenopathy.  Skin:    General: Skin is warm and dry.  Neurological:     Mental Status: She is alert and oriented to person, place, and time.  Psychiatric:        Behavior: Behavior normal.        Thought Content: Thought content normal.        Judgment: Judgment normal.    No visits with results within 3 Day(s) from this visit.  Latest known visit with results is:  Appointment on 07/12/2020  Component Date Value Ref Range Status  . Preg Test, Ur 07/12/2020 NEGATIVE  NEGATIVE Final   Performed  at [facility], [address] [place], [place] [address]    Assessment:  Kristine Lara is a 45 y.o. female with iron deficiency anemia.  She notes menorrhagia.  She is on oral iron daily.  Diet is good.  Menses has been twice monthly x 4 months.  She denies any other bleeding.  Labs on 04/28/2020 revealed a hematocrit was 23.7, hemoglobin 6.4, MCV 61.9, platelets 512,000, WBC 5,200. Iron saturation was <2% and TIBC 409.5.  Normal labs included:  CMP and TSH of 0.612 (normal).  Vitamin D, 25-hydroxy was 27.8 (30-100).  Work-up on 06/13/2020 revealed a hematocrit of 31.3, hemoglobin 9.0, MCV 72.8, platelets 463,000, WBC 5,800. Ferritin was 9 with an iron saturation of 7% and a TIBC of 322. PT/INR was 12.2/0.9 and PTT 30.   She received Venofer on 07/12/2020 and 07/17/2020.  Ferritin has been followed: 9  on 06/13/2020.  Transvaginal pelvic ultrasound on 05/22/2020 at [facility]. Results are not available.  She has never been on hormonal birth control except for a short trial of Depo.  She has never had a colonoscopy or EGD.  She has had 3 teeth extracted and did not have any excess bleeding.  The patient received the Moderna COVID-19 vaccine on 07/02/2019 and 07/30/2019. She received the flu shot on 01/14/2020.  Symptomatically, she feels "good." She feels less tired. She denies shortness of breath, chest pain, palpitations, and dizziness. Headaches have resolved.  Exam is unremarkable.  Plan: 1.   Review labs from 06/13/2020. 2.   Iron deficiency anemia  Symptomatically, she is feeling better.  Diet is good.  She has increased frequency of menses although anemia appears to have predated increased menses.  She has received Venofer weekly x 2.  Venofer today and in 1 week. 3.   Menorrhagia  Patient has increased frequency of menses.  She bruises easily.  She rarely takes aspirin or ibuprofen.  She denies any other excess bleeding.  PT and PTT were normal on  06/13/2020.  Consider future PFA and von Willebrand panel. 4.   Venofer today after urine pregnancy test. 5.   RTC in 1 week for Venofer and urine pregnancy test. 6.   RTC in 3-4 weeks for labs (CBC, ferritin, iron studies). 7.   RTC in 8 weeks for MD assessment, labs (CBC, ferritin, iron studies- day before ) and +/-Venofer.  I discussed the assessment and treatment plan with the patient.  The patient was provided an opportunity to ask questions and all were answered.  The patient agreed with the plan and demonstrated an understanding of the instructions.  The patient was advised to call back if the symptoms worsen or if the condition fails to improve as anticipated.   Kristine Holle C. Merlene Pulling, MD, PhD    07/19/2020, 3:04 PM  I, Danella Penton Tufford, am acting as Neurosurgeon for [employer]. Merlene Pulling, MD, PhD.  I, Maki Hege C. Merlene Pulling, MD, have reviewed the above documentation for accuracy and completeness, and I agree with the above.

## 2020-07-19 ENCOUNTER — Inpatient Hospital Stay: Payer: 59

## 2020-07-19 ENCOUNTER — Encounter: Payer: Self-pay | Admitting: Hematology and Oncology

## 2020-07-19 ENCOUNTER — Other Ambulatory Visit: Payer: Self-pay

## 2020-07-19 ENCOUNTER — Inpatient Hospital Stay (HOSPITAL_BASED_OUTPATIENT_CLINIC_OR_DEPARTMENT_OTHER): Payer: 59 | Admitting: Hematology and Oncology

## 2020-07-19 VITALS — BP 135/85 | HR 76 | Resp 18

## 2020-07-19 VITALS — BP 128/85 | HR 64 | Temp 98.0°F | Resp 18 | Wt 157.2 lb

## 2020-07-19 DIAGNOSIS — D500 Iron deficiency anemia secondary to blood loss (chronic): Secondary | ICD-10-CM | POA: Diagnosis not present

## 2020-07-19 DIAGNOSIS — N921 Excessive and frequent menstruation with irregular cycle: Secondary | ICD-10-CM | POA: Diagnosis not present

## 2020-07-19 DIAGNOSIS — D508 Other iron deficiency anemias: Secondary | ICD-10-CM | POA: Diagnosis not present

## 2020-07-19 LAB — PREGNANCY, URINE: Preg Test, Ur: NEGATIVE

## 2020-07-19 MED ORDER — SODIUM CHLORIDE 0.9 % IV SOLN: 200.0000 mg | Freq: Once | INTRAVENOUS | Status: DC

## 2020-07-19 MED ORDER — SODIUM CHLORIDE 0.9 % IV SOLN
Freq: Once | INTRAVENOUS | Status: AC
Start: 2020-07-19 — End: 2020-07-19
  Filled 2020-07-19: qty 250

## 2020-07-19 MED ORDER — IRON SUCROSE 20 MG/ML IV SOLN
200.0000 mg | Freq: Once | INTRAVENOUS | Status: AC
  Administered 2020-07-19: 200 mg via INTRAVENOUS
  Filled 2020-07-19: qty 10

## 2020-07-19 NOTE — Progress Notes (Signed)
Patient here for follow up. No new concerns voiced.  °

## 2020-07-24 ENCOUNTER — Ambulatory Visit: Payer: 59

## 2020-07-26 ENCOUNTER — Inpatient Hospital Stay: Payer: 59

## 2020-07-26 ENCOUNTER — Other Ambulatory Visit: Payer: Self-pay

## 2020-07-26 VITALS — BP 127/79 | HR 62 | Temp 97.0°F | Resp 18

## 2020-07-26 DIAGNOSIS — D508 Other iron deficiency anemias: Secondary | ICD-10-CM | POA: Diagnosis not present

## 2020-07-26 DIAGNOSIS — D500 Iron deficiency anemia secondary to blood loss (chronic): Secondary | ICD-10-CM

## 2020-07-26 LAB — PREGNANCY, URINE: Preg Test, Ur: NEGATIVE

## 2020-07-26 MED ORDER — SODIUM CHLORIDE 0.9 % IV SOLN: 200.0000 mg | Freq: Once | INTRAVENOUS | Status: DC

## 2020-07-26 MED ORDER — SODIUM CHLORIDE 0.9 % IV SOLN
Freq: Once | INTRAVENOUS | Status: AC
  Filled 2020-07-26: qty 250

## 2020-07-26 MED ORDER — IRON SUCROSE 20 MG/ML IV SOLN
200.0000 mg | Freq: Once | INTRAVENOUS | Status: AC
  Administered 2020-07-26: 200 mg via INTRAVENOUS
  Filled 2020-07-26: qty 10

## 2020-08-08 ENCOUNTER — Other Ambulatory Visit: Payer: Self-pay

## 2020-08-08 DIAGNOSIS — D500 Iron deficiency anemia secondary to blood loss (chronic): Secondary | ICD-10-CM

## 2020-08-09 ENCOUNTER — Other Ambulatory Visit: Payer: 59

## 2020-08-11 ENCOUNTER — Other Ambulatory Visit: Payer: Self-pay

## 2020-08-14 ENCOUNTER — Other Ambulatory Visit: Payer: Self-pay

## 2020-08-14 ENCOUNTER — Inpatient Hospital Stay: Payer: 59 | Attending: Oncology

## 2020-08-14 DIAGNOSIS — D509 Iron deficiency anemia, unspecified: Secondary | ICD-10-CM | POA: Insufficient documentation

## 2020-08-14 DIAGNOSIS — D500 Iron deficiency anemia secondary to blood loss (chronic): Secondary | ICD-10-CM

## 2020-08-14 LAB — IRON AND TIBC
Iron: 61 ug/dL (ref 28–170)
Saturation Ratios: 25 % (ref 10.4–31.8)
TIBC: 244 ug/dL — ABNORMAL LOW (ref 250–450)
UIBC: 183 ug/dL

## 2020-08-14 LAB — CBC WITH DIFFERENTIAL/PLATELET
Abs Immature Granulocytes: 0.01 10*3/uL (ref 0.00–0.07)
Basophils Absolute: 0.1 10*3/uL (ref 0.0–0.1)
Basophils Relative: 1 %
Eosinophils Absolute: 0.1 10*3/uL (ref 0.0–0.5)
Eosinophils Relative: 2 %
HCT: 36.3 % (ref 36.0–46.0)
Hemoglobin: 12.1 g/dL (ref 12.0–15.0)
Immature Granulocytes: 0 %
Lymphocytes Relative: 28 %
Lymphs Abs: 1.8 10*3/uL (ref 0.7–4.0)
MCH: 27.3 pg (ref 26.0–34.0)
MCHC: 33.3 g/dL (ref 30.0–36.0)
MCV: 81.9 fL (ref 80.0–100.0)
Monocytes Absolute: 0.4 10*3/uL (ref 0.1–1.0)
Monocytes Relative: 6 %
Neutro Abs: 3.9 10*3/uL (ref 1.7–7.7)
Neutrophils Relative %: 63 %
Platelets: 320 10*3/uL (ref 150–400)
RBC: 4.43 MIL/uL (ref 3.87–5.11)
RDW: 21.5 % — ABNORMAL HIGH (ref 11.5–15.5)
WBC: 6.3 10*3/uL (ref 4.0–10.5)
nRBC: 0 % (ref 0.0–0.2)

## 2020-08-14 LAB — FERRITIN: Ferritin: 57 ng/mL (ref 11–307)

## 2020-10-03 ENCOUNTER — Inpatient Hospital Stay: Payer: 59 | Attending: Nurse Practitioner

## 2020-10-03 ENCOUNTER — Other Ambulatory Visit: Payer: Self-pay

## 2020-10-03 DIAGNOSIS — D500 Iron deficiency anemia secondary to blood loss (chronic): Secondary | ICD-10-CM

## 2020-10-03 DIAGNOSIS — D508 Other iron deficiency anemias: Secondary | ICD-10-CM | POA: Insufficient documentation

## 2020-10-03 LAB — CBC WITH DIFFERENTIAL/PLATELET
Abs Immature Granulocytes: 0.02 10*3/uL (ref 0.00–0.07)
Basophils Absolute: 0 10*3/uL (ref 0.0–0.1)
Basophils Relative: 1 %
Eosinophils Absolute: 0.2 10*3/uL (ref 0.0–0.5)
Eosinophils Relative: 3 %
HCT: 34.8 % — ABNORMAL LOW (ref 36.0–46.0)
Hemoglobin: 12.2 g/dL (ref 12.0–15.0)
Immature Granulocytes: 0 %
Lymphocytes Relative: 25 %
Lymphs Abs: 1.6 10*3/uL (ref 0.7–4.0)
MCH: 30.3 pg (ref 26.0–34.0)
MCHC: 35.1 g/dL (ref 30.0–36.0)
MCV: 86.6 fL (ref 80.0–100.0)
Monocytes Absolute: 0.5 10*3/uL (ref 0.1–1.0)
Monocytes Relative: 8 %
Neutro Abs: 3.9 10*3/uL (ref 1.7–7.7)
Neutrophils Relative %: 63 %
Platelets: 335 10*3/uL (ref 150–400)
RBC: 4.02 MIL/uL (ref 3.87–5.11)
RDW: 15.2 % (ref 11.5–15.5)
WBC: 6.3 10*3/uL (ref 4.0–10.5)
nRBC: 0 % (ref 0.0–0.2)

## 2020-10-03 LAB — IRON AND TIBC
Iron: 29 ug/dL (ref 28–170)
Saturation Ratios: 12 % (ref 10.4–31.8)
TIBC: 251 ug/dL (ref 250–450)
UIBC: 222 ug/dL

## 2020-10-04 ENCOUNTER — Encounter: Payer: Self-pay | Admitting: Oncology

## 2020-10-04 ENCOUNTER — Inpatient Hospital Stay (HOSPITAL_BASED_OUTPATIENT_CLINIC_OR_DEPARTMENT_OTHER): Payer: 59 | Admitting: Oncology

## 2020-10-04 ENCOUNTER — Inpatient Hospital Stay: Payer: 59

## 2020-10-04 VITALS — BP 131/69 | HR 66 | Temp 97.8°F | Resp 14 | Wt 162.5 lb

## 2020-10-04 DIAGNOSIS — D500 Iron deficiency anemia secondary to blood loss (chronic): Secondary | ICD-10-CM | POA: Diagnosis not present

## 2020-10-04 DIAGNOSIS — D508 Other iron deficiency anemias: Secondary | ICD-10-CM | POA: Diagnosis not present

## 2020-10-05 ENCOUNTER — Encounter: Payer: Self-pay | Admitting: Hematology and Oncology

## 2020-10-05 NOTE — Progress Notes (Signed)
Hematology/Oncology Consult note [facility]  Telephone:([phone] Fax:([phone]) [phone]  Patient Care Team: Mickey Farber, MD as PCP - General (Internal Medicine)   Name of the patient: Kristine Lara  [id number]  Mar 24, 1976   Date of visit: 10/05/20  Diagnosis-iron deficiency anemia possibly secondary to menorrhagia  Chief complaint/ Reason for visit-routine follow-up of iron deficiency anemia  Heme/Onc history: Patient is a 45 year old female with history of iron deficiency anemia which has been attributed to heavy menstrual bleeding.  She still gets her menstrual cycles regularly and sometimes they can last for 7 days and they are heavy.  She has received IV iron in the past.  She has never undergone GI evaluation.  Interval history-her menstrual cycles last for 7 to 8 days in the first 3 to 4 days are particularly heavy.  ECOG PS- 0 Pain scale- 0   Review of systems- Review of Systems  Constitutional:  Negative for chills, fever, malaise/fatigue and weight loss.  HENT:  Negative for congestion, ear discharge and nosebleeds.   Eyes:  Negative for blurred vision.  Respiratory:  Negative for cough, hemoptysis, sputum production, shortness of breath and wheezing.   Cardiovascular:  Negative for chest pain, palpitations, orthopnea and claudication.  Gastrointestinal:  Negative for abdominal pain, blood in stool, constipation, diarrhea, heartburn, melena, nausea and vomiting.  Genitourinary:  Negative for dysuria, flank pain, frequency, hematuria and urgency.       Heavy menstrual cycles  Musculoskeletal:  Negative for back pain, joint pain and myalgias.  Skin:  Negative for rash.  Neurological:  Negative for dizziness, tingling, focal weakness, seizures, weakness and headaches.  Endo/Heme/Allergies:  Does not bruise/bleed easily.  Psychiatric/Behavioral:  Negative for depression and suicidal ideas. The patient does not have insomnia.    No Known  Allergies   History reviewed. No pertinent past medical history.   History reviewed. No pertinent surgical history.  Social History   Socioeconomic History   Marital status: Married    Spouse name: Not on file   Number of children: Not on file   Years of education: Not on file   Highest education level: Not on file  Occupational History   Not on file  Tobacco Use   Smoking status: Never   Smokeless tobacco: Former    Types: Snuff  Vaping Use   Vaping Use: Never used  Substance and Sexual Activity   Alcohol use: Never    Alcohol/week: 0.0 standard drinks   Drug use: Never   Sexual activity: Not on file  Other Topics Concern   Not on file  Social History Narrative   Not on file   Social Determinants of Health   Financial Resource Strain: Not on file  Food Insecurity: Not on file  Transportation Needs: Not on file  Physical Activity: Not on file  Stress: Not on file  Social Connections: Not on file  Intimate Partner Violence: Not on file    Family History  Problem Relation Age of Onset   Breast cancer Neg Hx      Current Outpatient Medications:    levocetirizine (XYZAL) 5 MG tablet, Take 1 tablet (5 mg total) by mouth every evening for 7 days. (Patient not taking: Reported on 10/04/2020), Disp: 7 tablet, Rfl: 0  Physical exam:  Vitals:   10/04/20 1408  BP: 131/69  Pulse: 66  Resp: 14  Temp: 97.8 F (36.6 C)  SpO2: 98%  Weight: 162 lb 7.7 oz (73.7 kg)   Physical Exam  No flowsheet data found. CBC Latest Ref Rng & Units 10/03/2020  WBC 4.0 - 10.5 K/uL 6.3  Hemoglobin 12.0 - 15.0 g/dL 13.0  Hematocrit 86.5 - 46.0 % 34.8(L)  Platelets 150 - 400 K/uL 335      Assessment and plan- Patient is a 45 y.o. female with history of iron deficiency anemia here for routine follow-up  Although patient's iron deficiency anemia may be secondary to menorrhagia she has not had GI work-up so far and she is 45 years of age.  I would therefore recommend referral to  gastroenterology at this time.  Patient received IV iron about 3 months ago when her hemoglobin had dropped down to 9 it is improved to 12.2 today.Iron studies show normal TIBC and an iron saturation of 12%.  Ferritin was not checked today but was checked a month ago and was 57.  I will hold off on giving her additional IV iron at this time.  Repeat CBC ferritin and iron studies in 3 in 6 months and I will see her back in 6 months   Visit Diagnosis 1. Iron deficiency anemia due to chronic blood loss      Dr. Owens Shark, MD, MPH [facility] [phone] 10/05/2020 10:30 AM

## 2020-10-27 DIAGNOSIS — J019 Acute sinusitis, unspecified: Secondary | ICD-10-CM | POA: Diagnosis not present

## 2020-10-27 DIAGNOSIS — J209 Acute bronchitis, unspecified: Secondary | ICD-10-CM | POA: Diagnosis not present

## 2020-10-27 DIAGNOSIS — J101 Influenza due to other identified influenza virus with other respiratory manifestations: Secondary | ICD-10-CM | POA: Diagnosis not present

## 2020-10-27 DIAGNOSIS — Z209 Contact with and (suspected) exposure to unspecified communicable disease: Secondary | ICD-10-CM | POA: Diagnosis not present

## 2020-11-07 DIAGNOSIS — Z1231 Encounter for screening mammogram for malignant neoplasm of breast: Secondary | ICD-10-CM | POA: Diagnosis not present

## 2020-11-07 DIAGNOSIS — Z1331 Encounter for screening for depression: Secondary | ICD-10-CM | POA: Diagnosis not present

## 2020-11-07 DIAGNOSIS — N898 Other specified noninflammatory disorders of vagina: Secondary | ICD-10-CM | POA: Diagnosis not present

## 2020-11-07 DIAGNOSIS — Z01419 Encounter for gynecological examination (general) (routine) without abnormal findings: Secondary | ICD-10-CM | POA: Diagnosis not present

## 2020-11-27 ENCOUNTER — Encounter: Payer: Self-pay | Admitting: Gastroenterology

## 2020-11-27 ENCOUNTER — Other Ambulatory Visit: Payer: Self-pay

## 2020-11-27 ENCOUNTER — Ambulatory Visit (INDEPENDENT_AMBULATORY_CARE_PROVIDER_SITE_OTHER): Payer: 59 | Admitting: Gastroenterology

## 2020-11-27 VITALS — BP 142/81 | HR 69 | Temp 98.0°F | Ht 62.0 in | Wt 168.8 lb

## 2020-11-27 DIAGNOSIS — D500 Iron deficiency anemia secondary to blood loss (chronic): Secondary | ICD-10-CM

## 2020-11-27 NOTE — Progress Notes (Signed)
Gastroenterology Consultation  Referring Provider:     Creig Hines, MD Primary Care Physician:  Mickey Farber, MD (Inactive) Primary Gastroenterologist:  Dr. Servando Snare     Reason for Consultation:     Iron deficiency anemia        HPI:   EVERETTE DIMAURO is a 45 y.o. y/o female referred for consultation & management of Iron deficiency anemia by Dr. Mickey Farber, MD (Inactive).  This patient comes in today with a report of having iron deficiency anemia.  The patient states that she has not gone through menopause and continues to have heavy periods. The patient has been treated with iron and has responded with her iron studies showing:  Component     Latest Ref Rng & Units 06/13/2020 08/14/2020 10/03/2020  Iron     28 - 170 ug/dL 21 (L) 61 29  TIBC     250 - 450 ug/dL 527 782 (L) 423  Saturation Ratios     10.4 - 31.8 % 7 (L) 25 12  UIBC     ug/dL 536 144 315   The patient's hemoglobin from 5 months ago was 9.0 with a MCV of 72.8 with her most recent labs 1 month ago showing a hemoglobin at 12.2 with an MCV of 86.  The patient denies any family history of colon cancer colon polyps.  The patient also denies any change in bowel habits, black stools or bloody stools.  She also denies any nausea or vomiting.  History reviewed. No pertinent past medical history.  History reviewed. No pertinent surgical history.  Prior to Admission medications   Medication Sig Start Date End Date Taking? Authorizing Provider  levocetirizine (XYZAL) 5 MG tablet Take 1 tablet (5 mg total) by mouth every evening for 7 days. Patient not taking: No sig reported 07/15/18 07/22/18  [name], PA-C  fexofenadine (ALLEGRA) 180 MG tablet Take 1 tablet (180 mg total) by mouth daily. Patient not taking: No sig reported 08/11/15 02/15/20  [name], PA-C  fluticasone [facility]) 50 MCG/ACT nasal spray Place 2 sprays into both nostrils daily. Patient not taking: No sig reported 08/11/15 02/15/20  Faythe Ghee, PA-C     Family History  Problem Relation Age of Onset   Breast cancer Neg Hx      Social History   Tobacco Use   Smoking status: Never   Smokeless tobacco: Former    Types: Snuff  Vaping Use   Vaping Use: Never used  Substance Use Topics   Alcohol use: Never    Alcohol/week: 0.0 standard drinks   Drug use: Never    Allergies as of 11/27/2020   (No Known Allergies)    Review of Systems:    All systems reviewed and negative except where noted in HPI.   Physical Exam:  BP (!) 142/81 (BP Location: Left Arm, Patient Position: Sitting, Cuff Size: Normal)   Pulse 69   Temp 98 F (36.7 C) (Temporal)   Ht 5\' 2"  (1.575 m)   Wt 168 lb 12.8 oz (76.6 kg)   BMI 30.87 kg/m  No LMP recorded. General:   Alert,  Well-developed, well-nourished, pleasant and cooperative in NAD Head:  Normocephalic and atraumatic. Eyes:  Sclera clear, no icterus.   Conjunctiva pink. Ears:  Normal auditory acuity. Neck:  Supple; no masses or thyromegaly. Lungs:  Respirations even and unlabored.  Clear throughout to auscultation.   No wheezes, crackles, or rhonchi. No acute distress. Heart:  Regular rate and  rhythm; no murmurs, clicks, rubs, or gallops. Abdomen:  Normal bowel sounds.  No bruits.  Soft, non-tender and non-distended without masses, hepatosplenomegaly or hernias noted.  No guarding or rebound tenderness.  Negative Carnett sign.   Rectal:  Deferred.  Pulses:  Normal pulses noted. Extremities:  No clubbing or edema.  No cyanosis. Neurologic:  Alert and oriented x3;  grossly normal neurologically. Skin:  Intact without significant lesions or rashes.  No jaundice. Lymph Nodes:  No significant cervical adenopathy. Psych:  Alert and cooperative. Normal mood and affect.  Imaging Studies: No results found.  Assessment and Plan:   Isatu D Venditto is a 45 y.o. y/o female who comes in with iron deficiency anemia and a history of heavy menstrual cycle.  The patient has not had a colonoscopy or EGD  in the past. The patient will be set up for an EGD and colonoscopy to look for source of the iron deficiency anemia and has been told that if the EGD and colonoscopy do not show anything she may need a capsule endoscopy.  The patient has been explained the plan and agrees with it.    Leslee Haueter, MD. FACG    Note: This dictation was prepared with Dragon dictation along with smaller phrase technology. Any transcriptional errors that result from this process are unintentional.    

## 2020-11-27 NOTE — H&P (View-Only) (Signed)
Gastroenterology Consultation  Referring Provider:     Creig Hines, MD Primary Care Physician:  Kristine Farber, MD (Inactive) Primary Gastroenterologist:  Dr. Servando Lara     Reason for Consultation:     Iron deficiency anemia        HPI:   Kristine Lara is a 45 y.o. y/o female referred for consultation & management of Iron deficiency anemia by Dr. Mickey Farber, MD (Inactive).  This patient comes in today with a report of having iron deficiency anemia.  The patient states that she has not gone through menopause and continues to have heavy periods. The patient has been treated with iron and has responded with her iron studies showing:  Component     Latest Ref Rng & Units 06/13/2020 08/14/2020 10/03/2020  Iron     28 - 170 ug/dL 21 (L) 61 29  TIBC     250 - 450 ug/dL 527 782 (L) 423  Saturation Ratios     10.4 - 31.8 % 7 (L) 25 12  UIBC     ug/dL 536 144 315   The patient's hemoglobin from 5 months ago was 9.0 with a MCV of 72.8 with her most recent labs 1 month ago showing a hemoglobin at 12.2 with an MCV of 86.  The patient denies any family history of colon cancer colon polyps.  The patient also denies any change in bowel habits, black stools or bloody stools.  She also denies any nausea or vomiting.  History reviewed. No pertinent past medical history.  History reviewed. No pertinent surgical history.  Prior to Admission medications   Medication Sig Start Date End Date Taking? Authorizing Provider  levocetirizine (XYZAL) 5 MG tablet Take 1 tablet (5 mg total) by mouth every evening for 7 days. Patient not taking: No sig reported 07/15/18 07/22/18  [name], PA-C  fexofenadine (ALLEGRA) 180 MG tablet Take 1 tablet (180 mg total) by mouth daily. Patient not taking: No sig reported 08/11/15 02/15/20  [name], PA-C  fluticasone [facility]) 50 MCG/ACT nasal spray Place 2 sprays into both nostrils daily. Patient not taking: No sig reported 08/11/15 02/15/20  Faythe Ghee, PA-C     Family History  Problem Relation Age of Onset   Breast cancer Neg Hx      Social History   Tobacco Use   Smoking status: Never   Smokeless tobacco: Former    Types: Snuff  Vaping Use   Vaping Use: Never used  Substance Use Topics   Alcohol use: Never    Alcohol/week: 0.0 standard drinks   Drug use: Never    Allergies as of 11/27/2020   (No Known Allergies)    Review of Systems:    All systems reviewed and negative except where noted in HPI.   Physical Exam:  BP (!) 142/81 (BP Location: Left Arm, Patient Position: Sitting, Cuff Size: Normal)   Pulse 69   Temp 98 F (36.7 C) (Temporal)   Ht 5\' 2"  (1.575 m)   Wt 168 lb 12.8 oz (76.6 kg)   BMI 30.87 kg/m  No LMP recorded. General:   Alert,  Well-developed, well-nourished, pleasant and cooperative in NAD Head:  Normocephalic and atraumatic. Eyes:  Sclera clear, no icterus.   Conjunctiva pink. Ears:  Normal auditory acuity. Neck:  Supple; no masses or thyromegaly. Lungs:  Respirations even and unlabored.  Clear throughout to auscultation.   No wheezes, crackles, or rhonchi. No acute distress. Heart:  Regular rate and  rhythm; no murmurs, clicks, rubs, or gallops. Abdomen:  Normal bowel sounds.  No bruits.  Soft, non-tender and non-distended without masses, hepatosplenomegaly or hernias noted.  No guarding or rebound tenderness.  Negative Carnett sign.   Rectal:  Deferred.  Pulses:  Normal pulses noted. Extremities:  No clubbing or edema.  No cyanosis. Neurologic:  Alert and oriented x3;  grossly normal neurologically. Skin:  Intact without significant lesions or rashes.  No jaundice. Lymph Nodes:  No significant cervical adenopathy. Psych:  Alert and cooperative. Normal mood and affect.  Imaging Studies: No results found.  Assessment and Plan:   Kristine Lara is a 45 y.o. y/o female who comes in with iron deficiency anemia and a history of heavy menstrual cycle.  The patient has not had a colonoscopy or EGD  in the past. The patient will be set up for an EGD and colonoscopy to look for source of the iron deficiency anemia and has been told that if the EGD and colonoscopy do not show anything she may need a capsule endoscopy.  The patient has been explained the plan and agrees with it.    [name], MD. Clementeen Graham    Note: This dictation was prepared with Dragon dictation along with smaller phrase technology. Any transcriptional errors that result from this process are unintentional.

## 2020-11-29 DIAGNOSIS — R300 Dysuria: Secondary | ICD-10-CM | POA: Diagnosis not present

## 2020-12-01 ENCOUNTER — Encounter: Payer: Self-pay | Admitting: Gastroenterology

## 2020-12-01 ENCOUNTER — Other Ambulatory Visit: Payer: Self-pay

## 2020-12-08 ENCOUNTER — Ambulatory Visit: Payer: 59 | Admitting: Anesthesiology

## 2020-12-08 ENCOUNTER — Encounter
Admission: RE | Disposition: A | Discharge status: Home / Self Care | Payer: Self-pay | Source: Home / Self Care | Attending: Gastroenterology

## 2020-12-08 ENCOUNTER — Other Ambulatory Visit: Payer: Self-pay

## 2020-12-08 ENCOUNTER — Encounter: Payer: Self-pay | Admitting: Gastroenterology

## 2020-12-08 ENCOUNTER — Ambulatory Visit
Admission: RE | Admit: 2020-12-08 | Discharge: 2020-12-08 | Disposition: A | Discharge status: Home / Self Care | Payer: 59 | Attending: Gastroenterology | Admitting: Gastroenterology

## 2020-12-08 DIAGNOSIS — D500 Iron deficiency anemia secondary to blood loss (chronic): Secondary | ICD-10-CM | POA: Diagnosis not present

## 2020-12-08 DIAGNOSIS — K2980 Duodenitis without bleeding: Secondary | ICD-10-CM | POA: Insufficient documentation

## 2020-12-08 DIAGNOSIS — D509 Iron deficiency anemia, unspecified: Secondary | ICD-10-CM | POA: Insufficient documentation

## 2020-12-08 DIAGNOSIS — K648 Other hemorrhoids: Secondary | ICD-10-CM | POA: Insufficient documentation

## 2020-12-08 DIAGNOSIS — Z87891 Personal history of nicotine dependence: Secondary | ICD-10-CM | POA: Insufficient documentation

## 2020-12-08 HISTORY — PX: ESOPHAGOGASTRODUODENOSCOPY (EGD) WITH PROPOFOL: SHX5813

## 2020-12-08 HISTORY — PX: COLONOSCOPY WITH PROPOFOL: SHX5780

## 2020-12-08 LAB — POCT PREGNANCY, URINE: Preg Test, Ur: NEGATIVE

## 2020-12-08 SURGERY — COLONOSCOPY WITH PROPOFOL
Anesthesia: General | Site: Rectum

## 2020-12-08 MED ORDER — GLYCOPYRROLATE 0.2 MG/ML IJ SOLN
INTRAMUSCULAR | Status: DC | PRN
  Administered 2020-12-08: .1 mg via INTRAVENOUS

## 2020-12-08 MED ORDER — STERILE WATER FOR IRRIGATION IR SOLN: Status: DC | PRN

## 2020-12-08 MED ORDER — PROPOFOL 10 MG/ML IV BOLUS
INTRAVENOUS | Status: DC | PRN
  Administered 2020-12-08: 150 mg via INTRAVENOUS
  Administered 2020-12-08: 30 mg via INTRAVENOUS
  Administered 2020-12-08: 50 mg via INTRAVENOUS
  Administered 2020-12-08: 30 mg via INTRAVENOUS
  Administered 2020-12-08: 40 mg via INTRAVENOUS
  Administered 2020-12-08: 30 mg via INTRAVENOUS

## 2020-12-08 MED ORDER — SODIUM CHLORIDE 0.9 % IV SOLN: INTRAVENOUS | Status: DC

## 2020-12-08 MED ORDER — ACETAMINOPHEN 325 MG PO TABS: 325.0000 mg | ORAL_TABLET | ORAL | Status: DC | PRN

## 2020-12-08 MED ORDER — LIDOCAINE HCL (CARDIAC) PF 100 MG/5ML IV SOSY
PREFILLED_SYRINGE | INTRAVENOUS | Status: DC | PRN
  Administered 2020-12-08: 30 mg via INTRAVENOUS

## 2020-12-08 MED ORDER — ONDANSETRON HCL 4 MG/2ML IJ SOLN: 4.0000 mg | Freq: Once | INTRAMUSCULAR | Status: DC | PRN

## 2020-12-08 MED ORDER — LACTATED RINGERS IV SOLN: INTRAVENOUS | Status: DC

## 2020-12-08 MED ORDER — ACETAMINOPHEN 160 MG/5ML PO SOLN: 325.0000 mg | ORAL | Status: DC | PRN

## 2020-12-08 SURGICAL SUPPLY — 8 items
BLOCK BITE 60FR ADLT L/F GRN (MISCELLANEOUS) ×3 IMPLANT
FORCEPS BIOP RAD 4 LRG CAP 4 (CUTTING FORCEPS) ×3 IMPLANT
GOWN CVR UNV OPN BCK APRN NK (MISCELLANEOUS) ×4 IMPLANT
GOWN ISOL THUMB LOOP REG UNIV (MISCELLANEOUS) ×6
KIT PRC NS LF DISP ENDO (KITS) ×2 IMPLANT
KIT PROCEDURE OLYMPUS (KITS) ×3
MANIFOLD NEPTUNE II (INSTRUMENTS) ×3 IMPLANT
WATER STERILE IRR 250ML POUR (IV SOLUTION) ×3 IMPLANT

## 2020-12-08 NOTE — Anesthesia Procedure Notes (Signed)
Date/Time: 12/08/2020 11:18 AM Performed by: Maree Krabbe, CRNA Pre-anesthesia Checklist: Patient identified, Emergency Drugs available, Suction available, Timeout performed and Patient being monitored Patient Re-evaluated:Patient Re-evaluated prior to induction Oxygen Delivery Method: Nasal cannula Placement Confirmation: positive ETCO2

## 2020-12-08 NOTE — Op Note (Signed)
[facility] Gastroenterology Patient Name: Kristine Lara Procedure Date: 12/08/2020 11:15 AM MRN: [id number] Account #: [id number] Date of Birth: [birth year] Admit Type: Outpatient Age: 45 Room: [facility] OR ROOM 01 Gender: Female Note Status: Finalized Procedure:             Upper GI endoscopy Indications:           Iron deficiency anemia Providers:             [name] MD, MD Referring MD:          Neomia Dear. Harrington Challenger, MD (Referring MD) Medicines:             Propofol per Anesthesia Complications:         No immediate complications. Procedure:             Pre-Anesthesia Assessment:                        - Prior to the procedure, a History and Physical was                         performed, and patient medications and allergies were                         reviewed. The patient's tolerance of previous                         anesthesia was also reviewed. The risks and benefits                         of the procedure and the sedation options and risks                         were discussed with the patient. All questions were                         answered, and informed consent was obtained. Prior                         Anticoagulants: The patient has taken no previous                         anticoagulant or antiplatelet agents. ASA Grade                         Assessment: II - A patient with mild systemic disease.                         After reviewing the risks and benefits, the patient                         was deemed in satisfactory condition to undergo the                         procedure.                        After obtaining informed consent, the endoscope was  passed under direct vision. Throughout the procedure,                         the patient's blood pressure, pulse, and oxygen                         saturations were monitored continuously. The was                         introduced through the mouth, and advanced to the                          second part of duodenum. The upper GI endoscopy was                         accomplished without difficulty. The patient tolerated                         the procedure well. Findings:      The examined esophagus was normal.      The entire examined stomach was normal.      The examined duodenum was normal. Biopsies for histology were taken with       a cold forceps for evaluation of celiac disease. Impression:            - Normal esophagus.                        - Normal stomach.                        - Normal examined duodenum. Biopsied. Recommendation:        - Discharge patient to home.                        - Resume previous diet.                        - Continue present medications.                        - Await pathology results.                        - Perform a colonoscopy today. Procedure Code(s):     --- Professional ---                        [phone], Esophagogastroduodenoscopy, flexible,                         transoral; with biopsy, single or multiple Diagnosis Code(s):     --- Professional ---                        D50.9, Iron deficiency anemia, unspecified CPT copyright 2019 American Medical Association. All rights reserved. The codes documented in this report are preliminary and upon coder review may  be revised to meet current compliance requirements. [name] MD, MD 12/08/2020 11:25:45 AM This report has been signed electronically. Number of Addenda: 0 Note Initiated On: 12/08/2020 11:15 AM Estimated Blood Loss:  Estimated blood loss: none.  [facility]

## 2020-12-08 NOTE — Op Note (Signed)
[facility] Gastroenterology Patient Name: Kristine Lara Procedure Date: 12/08/2020 11:16 AM MRN: [id number] Account #: [id number] Date of Birth: 1975/04/21 Admit Type: Outpatient Age: 45 Room: [facility] OR ROOM 01 Gender: Female Note Status: Finalized Procedure:             Colonoscopy Indications:           Iron deficiency anemia Providers:             [name] MD, MD Referring MD:          Neomia Dear. Harrington Challenger, MD (Referring MD) Medicines:             Propofol per Anesthesia Complications:         No immediate complications. Procedure:             Pre-Anesthesia Assessment:                        - Prior to the procedure, a History and Physical was                         performed, and patient medications and allergies were                         reviewed. The patient's tolerance of previous                         anesthesia was also reviewed. The risks and benefits                         of the procedure and the sedation options and risks                         were discussed with the patient. All questions were                         answered, and informed consent was obtained. Prior                         Anticoagulants: The patient has taken no previous                         anticoagulant or antiplatelet agents. ASA Grade                         Assessment: II - A patient with mild systemic disease.                         After reviewing the risks and benefits, the patient                         was deemed in satisfactory condition to undergo the                         procedure.                        After obtaining informed consent, the colonoscope was  passed under direct vision. Throughout the procedure,                         the patient's blood pressure, pulse, and oxygen                         saturations were monitored continuously. The                         Colonoscope was introduced through the anus and                          advanced to the the cecum, identified by appendiceal                         orifice and ileocecal valve. The colonoscopy was                         performed without difficulty. The patient tolerated                         the procedure well. The quality of the bowel                         preparation was excellent. Findings:      The perianal and digital rectal examinations were normal.      Non-bleeding internal hemorrhoids were found during retroflexion. The       hemorrhoids were Grade I (internal hemorrhoids that do not prolapse). Impression:            - Non-bleeding internal hemorrhoids.                        - No specimens collected. Recommendation:        - Discharge patient to home.                        - Resume previous diet.                        - Continue present medications.                        - To visualize the small bowel, perform video capsule                         endoscopy. Procedure Code(s):     --- Professional ---                        [phone], Colonoscopy, flexible; diagnostic, including                         collection of specimen(s) by brushing or washing, when                         performed (separate procedure) Diagnosis Code(s):     --- Professional ---                        D50.9, Iron deficiency anemia, unspecified CPT copyright 2019 American Medical Association.  All rights reserved. The codes documented in this report are preliminary and upon coder review may  be revised to meet current compliance requirements. [name] MD, MD 12/08/2020 11:41:23 AM This report has been signed electronically. Number of Addenda: 0 Note Initiated On: 12/08/2020 11:16 AM Scope Withdrawal Time: 0 hours 8 minutes 22 seconds  Total Procedure Duration: 0 hours 11 minutes 30 seconds  Estimated Blood Loss:  Estimated blood loss: none.      [name]

## 2020-12-08 NOTE — Interval H&P Note (Signed)
   [name], MD [facility] [address] [place], [place] [address] Phone:[phone] Fax : [phone]  Primary Care Physician:  Mickey Farber, MD (Inactive) Primary Gastroenterologist:  Dr. Servando Snare  Pre-Procedure History & Physical: HPI:  Kristine Lara is a 45 y.o. female is here for an endoscopy and colonoscopy.   History reviewed. No pertinent past medical history.  Past Surgical History:  Procedure Laterality Date   DILATION AND CURETTAGE OF UTERUS      Prior to Admission medications   Medication Sig Start Date End Date Taking? Authorizing Provider  levocetirizine (XYZAL) 5 MG tablet Take 1 tablet (5 mg total) by mouth every evening for 7 days. Patient not taking: No sig reported 07/15/18 07/22/18  [name], PA-C  fexofenadine (ALLEGRA) 180 MG tablet Take 1 tablet (180 mg total) by mouth daily. Patient not taking: No sig reported 08/11/15 02/15/20  [name], PA-C  fluticasone [facility]) 50 MCG/ACT nasal spray Place 2 sprays into both nostrils daily. Patient not taking: No sig reported 08/11/15 02/15/20  Faythe Ghee, PA-C    Allergies as of 11/27/2020   (No Known Allergies)    Family History  Problem Relation Age of Onset   Breast cancer Neg Hx     Social History   Socioeconomic History   Marital status: Married    Spouse name: Not on file   Number of children: Not on file   Years of education: Not on file   Highest education level: Not on file  Occupational History   Not on file  Tobacco Use   Smoking status: Never   Smokeless tobacco: Former    Types: Snuff  Vaping Use   Vaping Use: Never used  Substance and Sexual Activity   Alcohol use: Never    Alcohol/week: 0.0 standard drinks   Drug use: Never   Sexual activity: Not on file  Other Topics Concern   Not on file  Social History Narrative   Not on file   Social Determinants of Health   Financial Resource Strain: Not on file  Food Insecurity: Not on file  Transportation Needs:  Not on file  Physical Activity: Not on file  Stress: Not on file  Social Connections: Not on file  Intimate Partner Violence: Not on file    Review of Systems: See HPI, otherwise negative ROS  Physical Exam: BP 135/90   Pulse 67   Ht 5\' 2"  (1.575 m)   Wt 74.3 kg   LMP 11/23/2020 Comment: preg test neg  SpO2 97%   BMI 29.96 kg/m  General:   Alert,  pleasant and cooperative in NAD Head:  Normocephalic and atraumatic. Neck:  Supple; no masses or thyromegaly. Lungs:  Clear throughout to auscultation.    Heart:  Regular rate and rhythm. Abdomen:  Soft, nontender and nondistended. Normal bowel sounds, without guarding, and without rebound.   Neurologic:  Alert and  oriented x4;  grossly normal neurologically.  Impression/Plan: Kristine Lara is here for an endoscopy and colonoscopy to be performed for IDA  Risks, benefits, limitations, and alternatives regarding  endoscopy and colonoscopy have been reviewed with the patient.  Questions have been answered.  All parties agreeable.   [place], MD  12/08/2020, 10:45 AM

## 2020-12-08 NOTE — Anesthesia Preprocedure Evaluation (Signed)
Anesthesia Evaluation  Patient identified by MRN, date of birth, ID band Patient awake    Reviewed: Allergy & Precautions, NPO status , Patient's Chart, lab work & pertinent test results  History of Anesthesia Complications Negative for: history of anesthetic complications  Airway Mallampati: I  TM Distance: >3 FB Neck ROM: Full    Dental no notable dental hx.    Pulmonary neg pulmonary ROS,    Pulmonary exam normal breath sounds clear to auscultation       Cardiovascular Exercise Tolerance: Good negative cardio ROS Normal cardiovascular exam Rhythm:Regular Rate:Normal     Neuro/Psych negative neurological ROS  negative psych ROS   GI/Hepatic negative GI ROS, Neg liver ROS,   Endo/Other  negative endocrine ROS  Renal/GU negative Renal ROS  negative genitourinary   Musculoskeletal negative musculoskeletal ROS (+)   Abdominal   Peds negative pediatric ROS (+)  Hematology  (+) anemia ,   Anesthesia Other Findings   Reproductive/Obstetrics negative OB ROS                             Anesthesia Physical Anesthesia Plan  ASA: 2  Anesthesia Plan: General   Post-op Pain Management:    Induction: Intravenous  PONV Risk Score and Plan: Propofol infusion and Treatment may vary due to age or medical condition  Airway Management Planned: Natural Airway and Nasal Cannula  Additional Equipment:   Intra-op Plan:   Post-operative Plan:   Informed Consent: I have reviewed the patients History and Physical, chart, labs and discussed the procedure including the risks, benefits and alternatives for the proposed anesthesia with the patient or authorized representative who has indicated his/her understanding and acceptance.     Dental advisory given  Plan Discussed with: CRNA  Anesthesia Plan Comments:         Anesthesia Quick Evaluation  Patient Active Problem List   Diagnosis  Date Noted  . Menorrhagia with irregular cycle 07/03/2020  . Iron deficiency anemia due to chronic blood loss 06/13/2020  . Obesity (BMI 30-39.9) 07/15/2018    CBC Latest Ref Rng & Units 10/03/2020 08/14/2020 06/13/2020  WBC 4.0 - 10.5 K/uL 6.3 6.3 5.8  Hemoglobin 12.0 - 15.0 g/dL 78.4 69.6 9.0(L)  Hematocrit 36.0 - 46.0 % 34.8(L) 36.3 31.3(L)  Platelets 150 - 400 K/uL 335 320 463(H)   No flowsheet data found.  Risks and benefits of anesthesia discussed at length, patient or surrogate demonstrates understanding. Appropriately NPO. Plan to proceed with anesthesia.  Corlis Leak, MD 12/08/20

## 2020-12-08 NOTE — Transfer of Care (Signed)
Immediate Anesthesia Transfer of Care Note  Patient: Kristine Lara  Procedure(s) Performed: COLONOSCOPY WITH PROPOFOL (Rectum) ESOPHAGOGASTRODUODENOSCOPY (EGD) WITH BIOPSY (Mouth)  Patient Location: PACU  Anesthesia Type: General  Level of Consciousness: awake, alert  and patient cooperative  Airway and Oxygen Therapy: Patient Spontanous Breathing and Patient connected to supplemental oxygen  Post-op Assessment: Post-op Vital signs reviewed, Patient's Cardiovascular Status Stable, Respiratory Function Stable, Patent Airway and No signs of Nausea or vomiting  Post-op Vital Signs: Reviewed and stable  Complications: No notable events documented.

## 2020-12-08 NOTE — Anesthesia Postprocedure Evaluation (Signed)
Anesthesia Post Note  Patient: Kristine Lara  Procedure(s) Performed: COLONOSCOPY WITH PROPOFOL (Rectum) ESOPHAGOGASTRODUODENOSCOPY (EGD) WITH BIOPSY (Mouth)     Patient location during evaluation: PACU Anesthesia Type: General Level of consciousness: awake and alert Pain management: pain level controlled Vital Signs Assessment: post-procedure vital signs reviewed and stable Respiratory status: spontaneous breathing, nonlabored ventilation, respiratory function stable and patient connected to nasal cannula oxygen Cardiovascular status: blood pressure returned to baseline and stable Postop Assessment: no apparent nausea or vomiting Anesthetic complications: no   No notable events documented.  Edwyna Ready

## 2020-12-11 ENCOUNTER — Encounter: Payer: Self-pay | Admitting: Gastroenterology

## 2020-12-12 LAB — SURGICAL PATHOLOGY

## 2020-12-19 ENCOUNTER — Other Ambulatory Visit: Payer: Self-pay

## 2020-12-19 DIAGNOSIS — D500 Iron deficiency anemia secondary to blood loss (chronic): Secondary | ICD-10-CM

## 2021-01-04 ENCOUNTER — Other Ambulatory Visit: Payer: Self-pay

## 2021-01-04 ENCOUNTER — Inpatient Hospital Stay: Payer: 59 | Attending: Oncology

## 2021-01-04 DIAGNOSIS — D508 Other iron deficiency anemias: Secondary | ICD-10-CM | POA: Insufficient documentation

## 2021-01-04 DIAGNOSIS — D500 Iron deficiency anemia secondary to blood loss (chronic): Secondary | ICD-10-CM

## 2021-01-04 LAB — CBC WITH DIFFERENTIAL/PLATELET
Abs Immature Granulocytes: 0.01 10*3/uL (ref 0.00–0.07)
Basophils Absolute: 0.1 10*3/uL (ref 0.0–0.1)
Basophils Relative: 1 %
Eosinophils Absolute: 0.2 10*3/uL (ref 0.0–0.5)
Eosinophils Relative: 2 %
HCT: 36 % (ref 36.0–46.0)
Hemoglobin: 12.5 g/dL (ref 12.0–15.0)
Immature Granulocytes: 0 %
Lymphocytes Relative: 31 %
Lymphs Abs: 2 10*3/uL (ref 0.7–4.0)
MCH: 31 pg (ref 26.0–34.0)
MCHC: 34.7 g/dL (ref 30.0–36.0)
MCV: 89.3 fL (ref 80.0–100.0)
Monocytes Absolute: 0.5 10*3/uL (ref 0.1–1.0)
Monocytes Relative: 7 %
Neutro Abs: 3.8 10*3/uL (ref 1.7–7.7)
Neutrophils Relative %: 59 %
Platelets: 350 10*3/uL (ref 150–400)
RBC: 4.03 MIL/uL (ref 3.87–5.11)
RDW: 13.2 % (ref 11.5–15.5)
WBC: 6.5 10*3/uL (ref 4.0–10.5)
nRBC: 0 % (ref 0.0–0.2)

## 2021-01-04 LAB — IRON AND TIBC
Iron: 94 ug/dL (ref 28–170)
Saturation Ratios: 37 % — ABNORMAL HIGH (ref 10.4–31.8)
TIBC: 253 ug/dL (ref 250–450)
UIBC: 159 ug/dL

## 2021-01-04 LAB — FOLATE: Folate: 28 ng/mL (ref >5.9)

## 2021-01-04 LAB — FERRITIN: Ferritin: 12 ng/mL (ref 11–307)

## 2021-01-04 LAB — VITAMIN B12: Vitamin B-12: 2278 pg/mL — ABNORMAL HIGH (ref 180–914)

## 2021-01-08 ENCOUNTER — Encounter
Admission: RE | Disposition: A | Discharge status: Home / Self Care | Payer: Self-pay | Source: Home / Self Care | Attending: Gastroenterology

## 2021-01-08 ENCOUNTER — Ambulatory Visit
Admission: RE | Admit: 2021-01-08 | Discharge: 2021-01-08 | Disposition: A | Discharge status: Home / Self Care | Payer: 59 | Attending: Gastroenterology | Admitting: Gastroenterology

## 2021-01-08 DIAGNOSIS — K5520 Angiodysplasia of colon without hemorrhage: Secondary | ICD-10-CM | POA: Insufficient documentation

## 2021-01-08 DIAGNOSIS — K633 Ulcer of intestine: Secondary | ICD-10-CM | POA: Diagnosis not present

## 2021-01-08 DIAGNOSIS — D509 Iron deficiency anemia, unspecified: Secondary | ICD-10-CM | POA: Diagnosis not present

## 2021-01-08 DIAGNOSIS — D500 Iron deficiency anemia secondary to blood loss (chronic): Secondary | ICD-10-CM | POA: Diagnosis not present

## 2021-01-08 HISTORY — PX: GIVENS CAPSULE STUDY: SHX5432

## 2021-01-08 SURGERY — IMAGING PROCEDURE, GI TRACT, INTRALUMINAL, VIA CAPSULE

## 2021-01-09 ENCOUNTER — Encounter: Payer: Self-pay | Admitting: Gastroenterology

## 2021-02-28 ENCOUNTER — Other Ambulatory Visit: Payer: Self-pay

## 2021-02-28 ENCOUNTER — Encounter: Payer: Self-pay | Admitting: Hematology and Oncology

## 2021-02-28 MED ORDER — CARESTART COVID-19 HOME TEST VI KIT
PACK | 0 refills | Status: DC
  Filled 2021-02-28: qty 2, 4d supply, fill #0

## 2021-04-11 ENCOUNTER — Ambulatory Visit: Payer: 59 | Admitting: Oncology

## 2021-04-11 ENCOUNTER — Other Ambulatory Visit: Payer: 59

## 2021-04-13 ENCOUNTER — Inpatient Hospital Stay: Payer: 59

## 2021-04-13 ENCOUNTER — Encounter: Payer: Self-pay | Admitting: Nurse Practitioner

## 2021-04-13 ENCOUNTER — Other Ambulatory Visit: Payer: Self-pay | Admitting: Emergency Medicine

## 2021-04-13 ENCOUNTER — Inpatient Hospital Stay: Payer: 59 | Admitting: Nurse Practitioner

## 2021-04-13 DIAGNOSIS — D500 Iron deficiency anemia secondary to blood loss (chronic): Secondary | ICD-10-CM

## 2021-05-16 DIAGNOSIS — Z13220 Encounter for screening for lipoid disorders: Secondary | ICD-10-CM | POA: Diagnosis not present

## 2021-05-16 DIAGNOSIS — Z833 Family history of diabetes mellitus: Secondary | ICD-10-CM | POA: Diagnosis not present

## 2021-05-16 DIAGNOSIS — Z79899 Other long term (current) drug therapy: Secondary | ICD-10-CM | POA: Diagnosis not present

## 2021-05-16 DIAGNOSIS — D509 Iron deficiency anemia, unspecified: Secondary | ICD-10-CM | POA: Diagnosis not present

## 2021-05-16 DIAGNOSIS — Z131 Encounter for screening for diabetes mellitus: Secondary | ICD-10-CM | POA: Diagnosis not present

## 2021-05-16 DIAGNOSIS — Z0000 Encounter for general adult medical examination without abnormal findings: Secondary | ICD-10-CM | POA: Diagnosis not present

## 2021-09-07 IMAGING — MG DIGITAL SCREENING BILAT W/ TOMO W/ CAD
8 series · 8 of 24 positions shown · non-contrast
Comparison: Previous exam(s).

CLINICAL DATA: Screening.

EXAM:
DIGITAL SCREENING BILATERAL MAMMOGRAM WITH TOMO AND CAD

[R CC synth-2D]
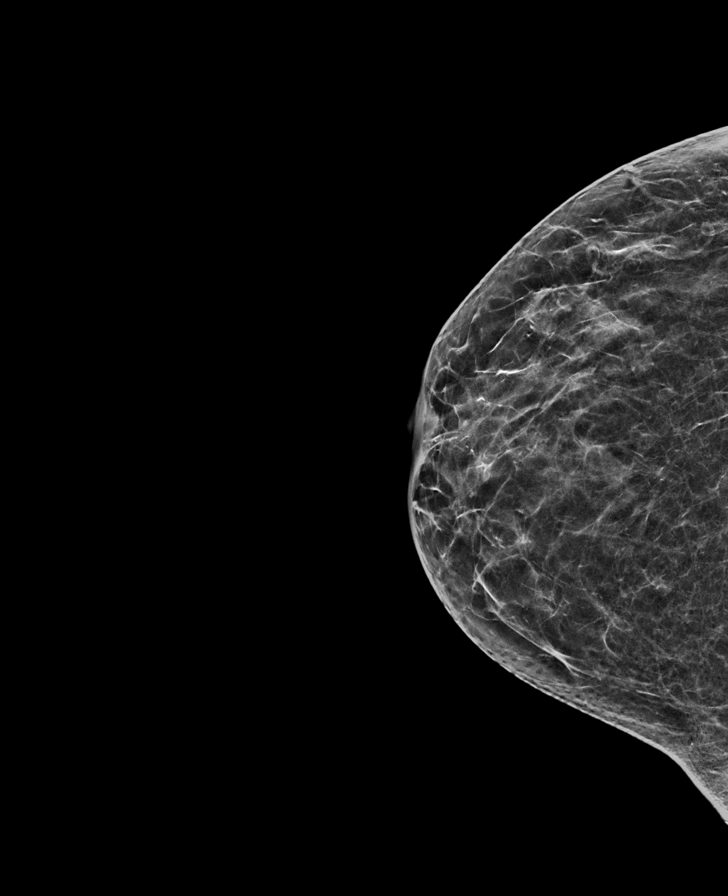

[L CC synth-2D]
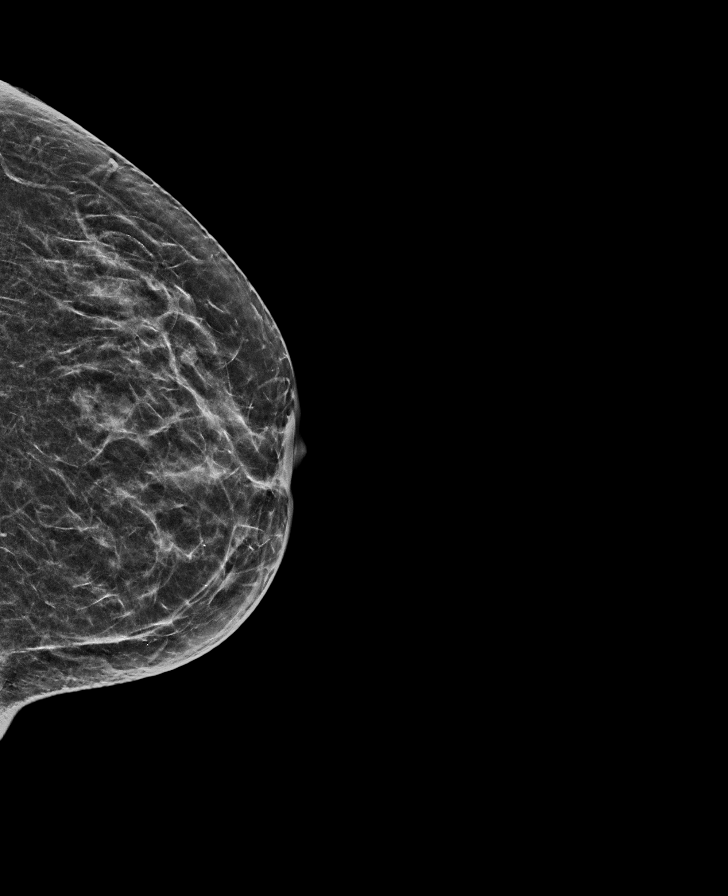

[R MLO synth-2D]
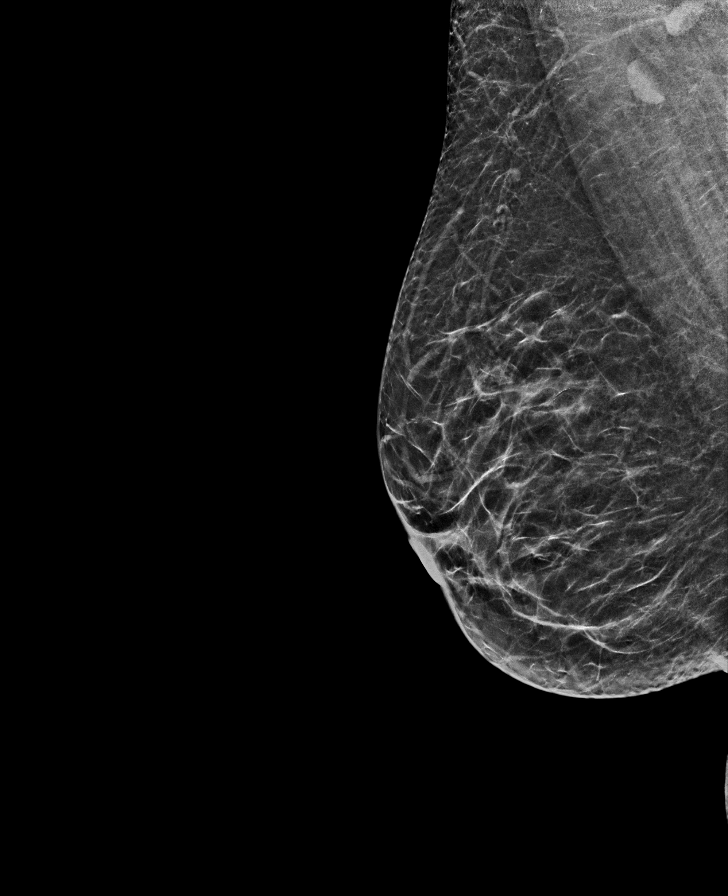

[L MLO synth-2D]
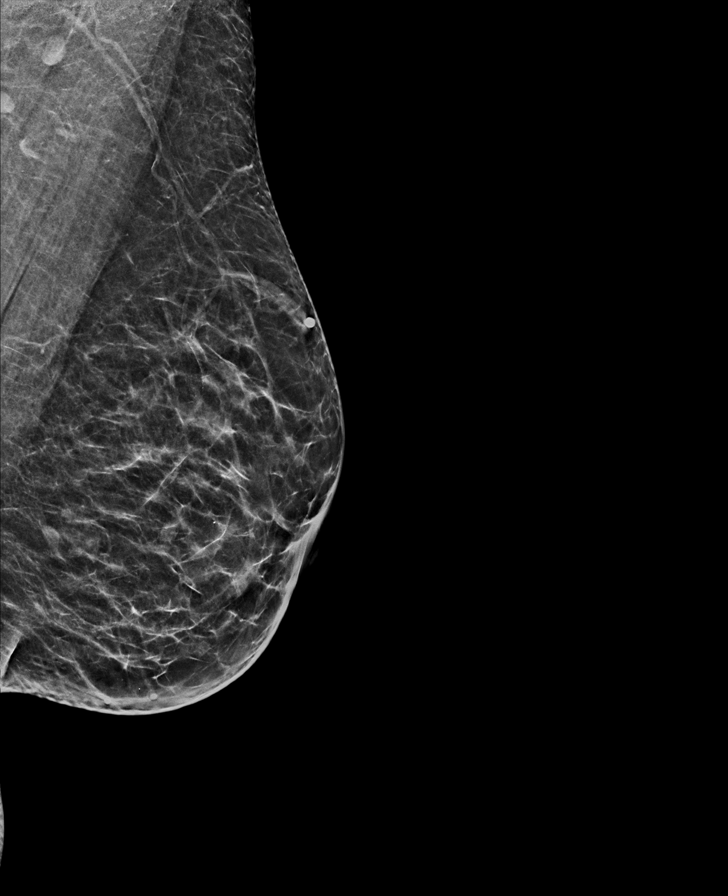

[R CC tomo · tomo slice 28/55.0]
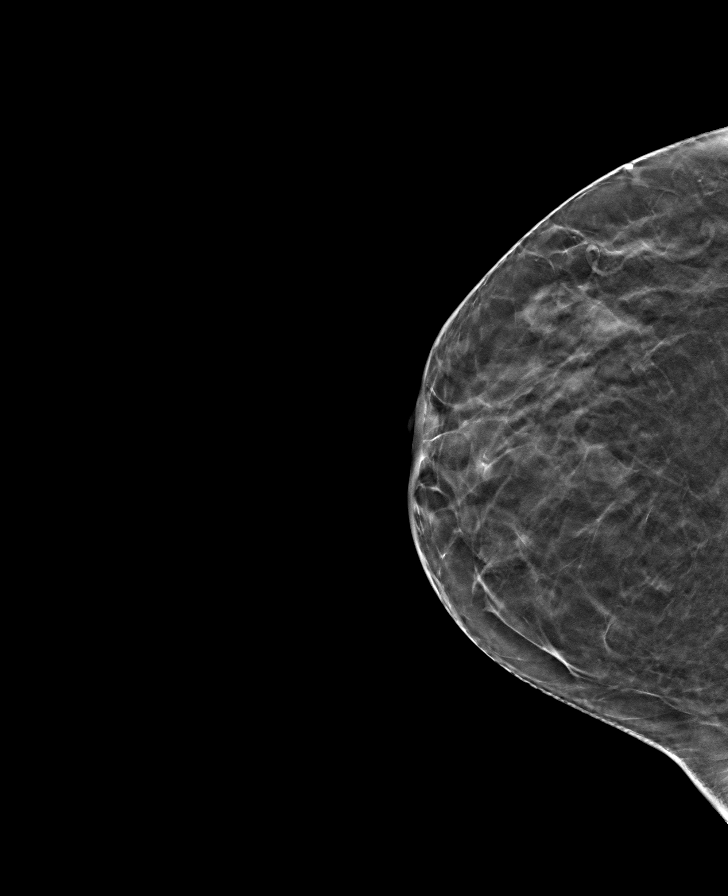

[L MLO tomo · tomo slice 29/56.0]
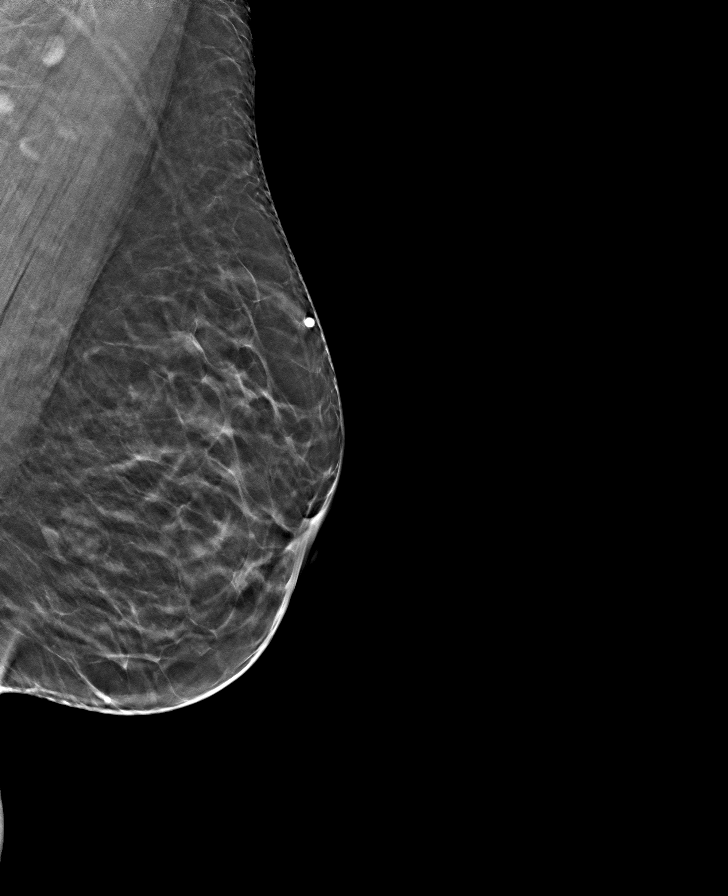

[L CC tomo · tomo slice 27/54.0]
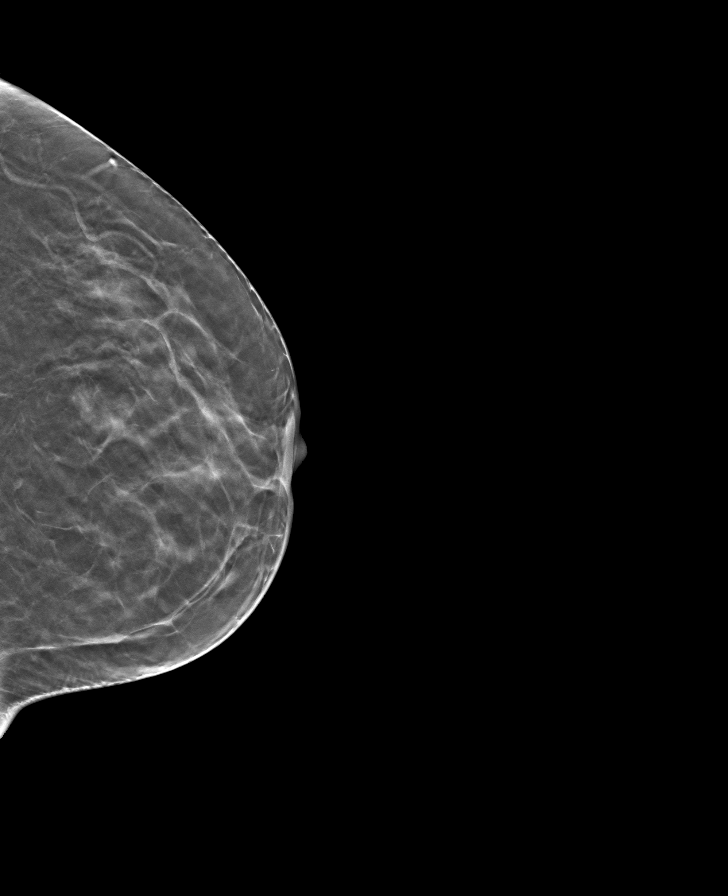

[R MLO tomo · tomo slice 29/57.0]
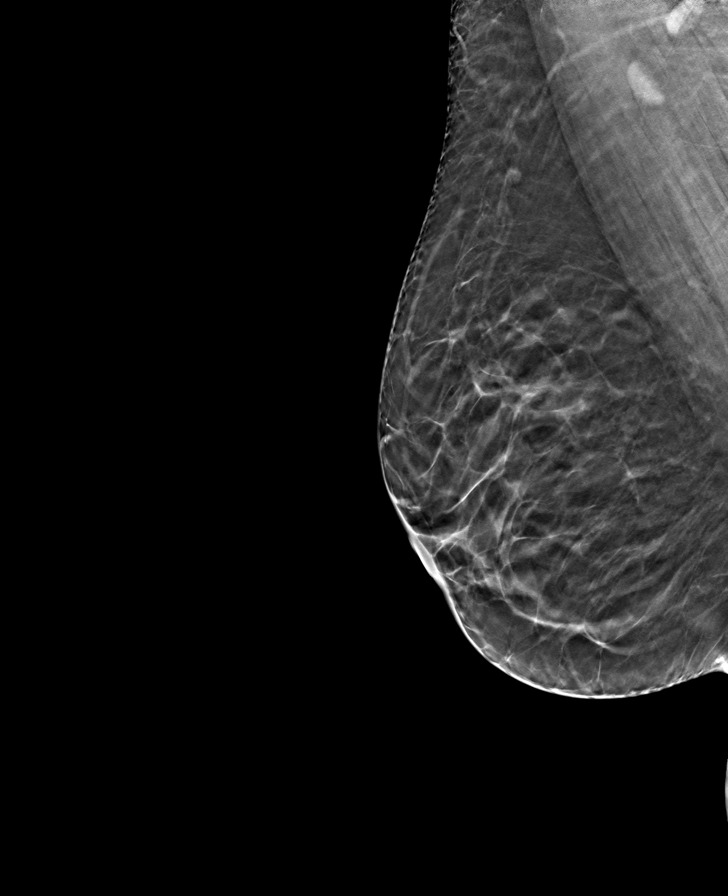

[8 of 24 positions shown; findings below may reference images not displayed]

ACR Breast Density Category b: There are scattered areas of
fibroglandular density.
FINDINGS: There are no findings suspicious for malignancy. Images were
processed with CAD.
IMPRESSION: No mammographic evidence of malignancy. A result letter of this
screening mammogram will be mailed directly to the patient.

RECOMMENDATION:
Screening mammogram in one year. (Code:[id number])

BI-RADS CATEGORY  1: Negative.

## 2021-11-06 DIAGNOSIS — T63441A Toxic effect of venom of bees, accidental (unintentional), initial encounter: Secondary | ICD-10-CM | POA: Diagnosis not present

## 2021-11-13 DIAGNOSIS — N939 Abnormal uterine and vaginal bleeding, unspecified: Secondary | ICD-10-CM | POA: Diagnosis not present

## 2021-11-13 DIAGNOSIS — Z01411 Encounter for gynecological examination (general) (routine) with abnormal findings: Secondary | ICD-10-CM | POA: Diagnosis not present

## 2021-11-13 DIAGNOSIS — N944 Primary dysmenorrhea: Secondary | ICD-10-CM | POA: Diagnosis not present

## 2021-11-13 DIAGNOSIS — N920 Excessive and frequent menstruation with regular cycle: Secondary | ICD-10-CM | POA: Diagnosis not present

## 2021-11-13 DIAGNOSIS — Z1331 Encounter for screening for depression: Secondary | ICD-10-CM | POA: Diagnosis not present

## 2021-11-15 ENCOUNTER — Other Ambulatory Visit: Payer: Self-pay | Admitting: Obstetrics and Gynecology

## 2021-12-04 DIAGNOSIS — N920 Excessive and frequent menstruation with regular cycle: Secondary | ICD-10-CM | POA: Diagnosis not present

## 2021-12-04 DIAGNOSIS — N944 Primary dysmenorrhea: Secondary | ICD-10-CM | POA: Diagnosis not present

## 2021-12-04 DIAGNOSIS — Z01818 Encounter for other preprocedural examination: Secondary | ICD-10-CM | POA: Diagnosis not present

## 2021-12-05 ENCOUNTER — Other Ambulatory Visit: Payer: 59

## 2021-12-11 ENCOUNTER — Other Ambulatory Visit: Payer: Self-pay

## 2021-12-11 ENCOUNTER — Encounter
Admission: RE | Admit: 2021-12-11 | Discharge: 2021-12-11 | Disposition: A | Discharge status: Home / Self Care | Payer: 59 | Source: Ambulatory Visit | Attending: Obstetrics and Gynecology | Admitting: Obstetrics and Gynecology

## 2021-12-11 DIAGNOSIS — Z01812 Encounter for preprocedural laboratory examination: Secondary | ICD-10-CM

## 2021-12-11 DIAGNOSIS — D500 Iron deficiency anemia secondary to blood loss (chronic): Secondary | ICD-10-CM

## 2021-12-11 HISTORY — DX: Personal history of urinary calculi: Z87.442

## 2021-12-11 HISTORY — DX: Anemia, unspecified: D64.9

## 2021-12-11 NOTE — Patient Instructions (Addendum)
Your procedure is scheduled on: 12/20/21 - THURSDAY Report to the Registration Desk on the 1st floor of the [facility]. To find out your arrival time, please call [phone] between 1PM - 3PM on: 12/19/21 [facility] If your arrival time is 6:00 am, do not arrive prior to that time as the [facility] entrance doors do not open until 6:00 am.  REMEMBER: Instructions that are not followed completely may result in serious medical risk, up to and including death; or upon the discretion of your surgeon and anesthesiologist your surgery may need to be rescheduled.  Do not eat food after midnight the night before surgery.  No gum chewing, lozengers or hard candies.  TAKE THESE MEDICATIONS THE MORNING OF SURGERY WITH A SIP OF WATER: none  One week prior to surgery: Stop Anti-inflammatories (NSAIDS) such as Advil, Aleve, Ibuprofen, Motrin, Naproxen, Naprosyn and Aspirin based products such as Excedrin, Goodys Powder, BC Powder.  Stop ANY OVER THE COUNTER supplements until after surgery BEGINNING 12/13/21.  You may however, continue to take Tylenol if needed for pain up until the day of surgery.  No Alcohol for 24 hours before or after surgery.  No Smoking including e-cigarettes for 24 hours prior to surgery.  No chewable tobacco products for at least 6 hours prior to surgery.  No nicotine patches on the day of surgery.  Do not use any "recreational" drugs for at least a week prior to your surgery.  Please be advised that the combination of cocaine and anesthesia may have negative outcomes, up to and including death. If you test positive for cocaine, your surgery will be cancelled.  On the morning of surgery brush your teeth with toothpaste and water, you may rinse your mouth with mouthwash if you wish. Do not swallow any toothpaste or mouthwash.  Use CHG Soap or wipes as directed on instruction sheet.  Do not wear jewelry, make-up, hairpins, clips or nail polish.  Do not wear  lotions, powders, or perfumes.   Do not shave body from the neck down 48 hours prior to surgery just in case you cut yourself which could leave a site for infection.  Also, freshly shaved skin may become irritated if using the CHG soap.  Contact lenses, hearing aids and dentures may not be worn into surgery.  Do not bring valuables to the hospital. [facility] is not responsible for any missing/lost belongings or valuables.   Notify your doctor if there is any change in your medical condition (cold, fever, infection).  Wear comfortable clothing (specific to your surgery type) to the hospital.  After surgery, you can help prevent lung complications by doing breathing exercises.  Take deep breaths and cough every 1-2 hours. Your doctor may order a device called an Incentive Spirometer to help you take deep breaths. When coughing or sneezing, hold a pillow firmly against your incision with both hands. This is called "splinting." Doing this helps protect your incision. It also decreases belly discomfort.  If you are being admitted to the hospital overnight, leave your suitcase in the car. After surgery it may be brought to your room.  If you are being discharged the day of surgery, you will not be allowed to drive home. You will need a responsible adult (18 years or older) to drive you home and stay with you that night.   If you are taking public transportation, you will need to have a responsible adult (18 years or older) with you. Please confirm with your  physician that it is acceptable to use public transportation.   Please call the Pre-admissions Testing Dept. at [phone] if you have any questions about these instructions.  Surgery Visitation Policy:  Patients undergoing a surgery or procedure may have two family members or support persons with them as long as the person is not COVID-19 positive or experiencing its symptoms.   Inpatient Visitation:    Visiting hours are 7 a.m.  to 8 p.m. Up to four visitors are allowed at one time in a patient room, including children. The visitors may rotate out with other people during the day. One designated support person (adult) may remain overnight.

## 2021-12-11 NOTE — H&P (Signed)
Kristine Lara [place] is a 46 y.o. female presenting with Pre Op Consulting (Sign consents)   History of Present Illness: Patient returns today for preop. She has long hx of menometrorrhagia and failed medical management. She has requested hysterectomy.    Workup: Pap 10/2020:  EMBx 11/2021: PROLIFERATIVE ENDOMETRIUM. NEGATIVE FOR HYPERPLASIA, MALIGNANCY, POLYP AND ENDOMETRITIS.   TVUS 05/2020: Ut wnl 9.23 x 5.33 x 4.35 cm  Endometrium=8.41 mm bil ov cysts seen 1 rt  Simple= 1.00 cm 1 lt complex  = 1.73 cm   Past Medical History:  has a past medical history of Allergic rhinitis, Iron deficiency anemia (04/28/2020), Low back pain, and Obesity (BMI 30-39.9), unspecified.  Past Surgical History:  has a past surgical history that includes Colonoscopy (12/08/2020); egd (12/08/2020); and capsule endoscopy (01/10/2021). Family History: family history includes Diabetes type II in her father; High blood pressure (Hypertension) in her mother, sister, and sister; Hyperlipidemia (Elevated cholesterol) in her brother, mother, sister, and sister; Obesity in her brother. Social History:  reports that she has never smoked. She has never used smokeless tobacco. She reports that she does not drink alcohol and does not use drugs. OB/GYN History:  OB History       Gravida 5   Para 3   Term 3   Preterm     AB 2   Living 3       SAB 2   IAB     Ectopic     Molar     Multiple     Live Births          Allergies: has No Known Allergies. Medications: Current Outpatient Medications:    albuterol 90 mcg/actuation inhaler, Inhale 2 inhalations into the lungs every 6 (six) hours as needed for Wheezing., Disp: 1 Inhaler, Rfl: 1   amoxicillin (AMOXIL) 500 MG capsule, , Disp: , Rfl:    cholecalciferol (VITAMIN D3) 1000 unit tablet, Take 1,000 Units by mouth once daily., Disp: , Rfl:    cyanocobalamin (VITAMIN B12) 1000 MCG tablet, Take 1,000 mcg by mouth once daily., Disp: , Rfl:     FERROUS SULFATE, DRIED (IRON, DRIED, ORAL), Take 1 tablet by mouth once daily., Disp: , Rfl:    fexofenadine (ALLEGRA) 180 MG tablet, Take 1 tablet (180 mg total) by mouth once daily as needed, Disp: 30 tablet, Rfl: 0   fluticasone (FLONASE) 50 mcg/actuation nasal spray, Place 2 sprays into both nostrils 2 (two) times daily as needed  , Disp: , Rfl:    Review of Systems: No SOB, no palpitations or chest pain, no new lower extremity edema, no nausea or vomiting or bowel or bladder complaints. See HPI for gyn specific ROS.    Exam:   BP 127/83   Pulse 83   Ht 160 cm (5\' 3" )   Wt 78.5 kg (173 lb)   LMP 11/26/2021 (Approximate)   BMI 30.65 kg/m    General: Patient is well-groomed, well-nourished, appears stated age in no acute distress   HEENT: head is atraumatic and normocephalic, trachea is midline, neck is supple with no palpable nodules   CV: Regular rhythm and normal heart rate, no murmur   Pulm: Clear to auscultation throughout lung fields with no wheezing, crackles, or rhonchi. No increased work of breathing   Abdomen: soft , no mass, non-tender, no rebound tenderness, no hepatomegaly   Pelvic: tanner stage 5 ,              External genitalia: vulva /labia no lesions  Urethra: no prolapse             Vagina: normal physiologic d/c, laxity in vaginal walls             Cervix: no lesions, no cervical motion tenderness, good descent             Uterus: normal size shape and contour, non-tender             Adnexa: no mass,  non-tender               Rectovaginal: External wnl   Impression:   The primary encounter diagnosis was Preop examination. Diagnoses of Excessive or frequent menstruation and Primary dysmenorrhea were also pertinent to this visit.   Plan:   Menorrhagia and dysmenorrhea  -Patient returns for a preoperative discussion regarding her plans to proceed with surgical treatment of her menorrhagia and dysmenorrhea by total laparoscopic hysterectomy  with bilateral salpingectomy procedure.  We may perform a cystoscopy to evaluate the urinary tract after the procedure, if surgically indicated for uro tract integrity.    The patient and I discussed the technical aspects of the procedure including the potential for risks and complications.  These include but are not limited to the risk of infection requiring post-operative antibiotics or further procedures.  We talked about the risk of injury to adjacent organs including bladder, bowel, ureter, blood vessels or nerves.  We talked about the need to convert to an open incision.  We talked about the possible need for blood transfusion.  We talked about postop complications such as thromboembolic or cardiopulmonary complications.  All of her questions were answered.  Her preoperative exam was completed and the appropriate consents were signed. She is scheduled to undergo this procedure in the near future.   Specific Peri-operative Considerations:  - Consent: obtained today - Health Maintenance:  - Labs: CBC, CMP preoperatively - Studies: EKG, CXR preoperatively - Bowel Preparation: None required - Abx:  Ancef 2g - VTE ppx: SCDs perioperatively - Glucose Protocol:  - Beta-blockade:      Diagnoses and all orders for this visit:   Preop examination   Excessive or frequent menstruation   Primary dysmenorrhea

## 2021-12-12 ENCOUNTER — Encounter: Payer: Self-pay | Admitting: Urgent Care

## 2021-12-12 ENCOUNTER — Encounter
Admission: RE | Admit: 2021-12-12 | Discharge: 2021-12-12 | Disposition: A | Discharge status: Home / Self Care | Payer: 59 | Source: Ambulatory Visit | Attending: Obstetrics and Gynecology | Admitting: Obstetrics and Gynecology

## 2021-12-12 DIAGNOSIS — Z01818 Encounter for other preprocedural examination: Secondary | ICD-10-CM | POA: Diagnosis not present

## 2021-12-12 DIAGNOSIS — N921 Excessive and frequent menstruation with irregular cycle: Secondary | ICD-10-CM | POA: Diagnosis not present

## 2021-12-12 DIAGNOSIS — Z01812 Encounter for preprocedural laboratory examination: Secondary | ICD-10-CM

## 2021-12-12 DIAGNOSIS — D500 Iron deficiency anemia secondary to blood loss (chronic): Secondary | ICD-10-CM

## 2021-12-12 LAB — BASIC METABOLIC PANEL
Anion gap: 2 — ABNORMAL LOW (ref 5–15)
BUN: 13 mg/dL (ref 6–20)
CO2: 27 mmol/L (ref 22–32)
Calcium: 8.6 mg/dL — ABNORMAL LOW (ref 8.9–10.3)
Chloride: 109 mmol/L (ref 98–111)
Creatinine, Ser: 0.84 mg/dL (ref 0.44–1.00)
GFR, Estimated: >60 mL/min (ref >60)
Glucose, Bld: 114 mg/dL — ABNORMAL HIGH (ref 70–99)
Potassium: 3.4 mmol/L — ABNORMAL LOW (ref 3.5–5.1)
Sodium: 138 mmol/L (ref 135–145)

## 2021-12-12 LAB — TYPE AND SCREEN
ABO/RH(D): O POS
Antibody Screen: NEGATIVE

## 2021-12-12 LAB — CBC
HCT: 31.7 % — ABNORMAL LOW (ref 36.0–46.0)
Hemoglobin: 10.5 g/dL — ABNORMAL LOW (ref 12.0–15.0)
MCH: 28.8 pg (ref 26.0–34.0)
MCHC: 33.1 g/dL (ref 30.0–36.0)
MCV: 86.8 fL (ref 80.0–100.0)
Platelets: 425 10*3/uL — ABNORMAL HIGH (ref 150–400)
RBC: 3.65 MIL/uL — ABNORMAL LOW (ref 3.87–5.11)
RDW: 14.1 % (ref 11.5–15.5)
WBC: 6.4 10*3/uL (ref 4.0–10.5)
nRBC: 0 % (ref 0.0–0.2)

## 2021-12-13 ENCOUNTER — Encounter: Admission: RE | Admit: 2021-12-13 | Payer: 59 | Source: Ambulatory Visit

## 2021-12-20 ENCOUNTER — Other Ambulatory Visit: Payer: Self-pay

## 2021-12-20 ENCOUNTER — Ambulatory Visit
Admission: RE | Admit: 2021-12-20 | Discharge: 2021-12-20 | Disposition: A | Discharge status: Home / Self Care | Payer: 59 | Source: Ambulatory Visit | Attending: Obstetrics and Gynecology | Admitting: Obstetrics and Gynecology

## 2021-12-20 ENCOUNTER — Ambulatory Visit: Payer: 59 | Admitting: Anesthesiology

## 2021-12-20 ENCOUNTER — Encounter
Admission: RE | Disposition: A | Discharge status: Home / Self Care | Payer: Self-pay | Source: Ambulatory Visit | Attending: Obstetrics and Gynecology

## 2021-12-20 ENCOUNTER — Encounter: Payer: Self-pay | Admitting: Obstetrics and Gynecology

## 2021-12-20 DIAGNOSIS — N921 Excessive and frequent menstruation with irregular cycle: Secondary | ICD-10-CM | POA: Diagnosis not present

## 2021-12-20 DIAGNOSIS — N840 Polyp of corpus uteri: Secondary | ICD-10-CM | POA: Diagnosis not present

## 2021-12-20 DIAGNOSIS — N946 Dysmenorrhea, unspecified: Secondary | ICD-10-CM | POA: Diagnosis not present

## 2021-12-20 DIAGNOSIS — E663 Overweight: Secondary | ICD-10-CM | POA: Diagnosis not present

## 2021-12-20 DIAGNOSIS — N736 Female pelvic peritoneal adhesions (postinfective): Secondary | ICD-10-CM | POA: Insufficient documentation

## 2021-12-20 DIAGNOSIS — D509 Iron deficiency anemia, unspecified: Secondary | ICD-10-CM | POA: Insufficient documentation

## 2021-12-20 DIAGNOSIS — Z01812 Encounter for preprocedural laboratory examination: Secondary | ICD-10-CM

## 2021-12-20 DIAGNOSIS — Z6828 Body mass index (BMI) 28.0-28.9, adult: Secondary | ICD-10-CM | POA: Insufficient documentation

## 2021-12-20 DIAGNOSIS — N939 Abnormal uterine and vaginal bleeding, unspecified: Secondary | ICD-10-CM | POA: Diagnosis not present

## 2021-12-20 DIAGNOSIS — N8003 Adenomyosis of the uterus: Secondary | ICD-10-CM | POA: Insufficient documentation

## 2021-12-20 HISTORY — PX: LYSIS OF ADHESION: SHX5961

## 2021-12-20 HISTORY — PX: ROBOTIC ASSISTED TOTAL HYSTERECTOMY WITH BILATERAL SALPINGO OOPHERECTOMY: SHX6086

## 2021-12-20 LAB — ABO/RH: ABO/RH(D): O POS

## 2021-12-20 LAB — POCT PREGNANCY, URINE: Preg Test, Ur: NEGATIVE

## 2021-12-20 SURGERY — HYSTERECTOMY, TOTAL, ROBOT-ASSISTED, LAPAROSCOPIC, WITH BILATERAL SALPINGO-OOPHORECTOMY
Anesthesia: General | Site: Abdomen

## 2021-12-20 MED ORDER — PROPOFOL 10 MG/ML IV BOLUS
INTRAVENOUS | Status: DC | PRN
  Administered 2021-12-20: 150 mg via INTRAVENOUS

## 2021-12-20 MED ORDER — FAMOTIDINE 20 MG PO TABS
ORAL_TABLET | ORAL | Status: AC
  Administered 2021-12-20: 20 mg via ORAL
  Filled 2021-12-20: qty 1

## 2021-12-20 MED ORDER — PROPOFOL 10 MG/ML IV BOLUS
INTRAVENOUS | Status: AC
  Filled 2021-12-20: qty 20

## 2021-12-20 MED ORDER — ACETAMINOPHEN 500 MG PO TABS: 1000.0000 mg | ORAL_TABLET | ORAL | Status: AC

## 2021-12-20 MED ORDER — FENTANYL CITRATE (PF) 100 MCG/2ML IJ SOLN
INTRAMUSCULAR | Status: AC
  Filled 2021-12-20: qty 2

## 2021-12-20 MED ORDER — ROCURONIUM BROMIDE 10 MG/ML (PF) SYRINGE
PREFILLED_SYRINGE | INTRAVENOUS | Status: AC
  Filled 2021-12-20: qty 10

## 2021-12-20 MED ORDER — LACTATED RINGERS IV SOLN: INTRAVENOUS | Status: DC

## 2021-12-20 MED ORDER — FENTANYL CITRATE (PF) 100 MCG/2ML IJ SOLN
INTRAMUSCULAR | Status: DC | PRN
Start: 2021-12-20 — End: 2021-12-20
  Administered 2021-12-20 (×4): 50 ug via INTRAVENOUS

## 2021-12-20 MED ORDER — BUPIVACAINE HCL (PF) 0.5 % IJ SOLN
INTRAMUSCULAR | Status: AC
  Filled 2021-12-20: qty 30

## 2021-12-20 MED ORDER — ONDANSETRON HCL 4 MG/2ML IJ SOLN
INTRAMUSCULAR | Status: AC
  Filled 2021-12-20: qty 2

## 2021-12-20 MED ORDER — FLUORESCEIN SODIUM 10 % IV SOLN
INTRAVENOUS | Status: DC | PRN
  Administered 2021-12-20: 50 mg via INTRAVENOUS

## 2021-12-20 MED ORDER — MIDAZOLAM HCL 2 MG/2ML IJ SOLN
INTRAMUSCULAR | Status: AC
  Filled 2021-12-20: qty 2

## 2021-12-20 MED ORDER — BUPIVACAINE HCL (PF) 0.5 % IJ SOLN
INTRAMUSCULAR | Status: DC | PRN
  Administered 2021-12-20: 10 mL
  Administered 2021-12-20: 7 mL

## 2021-12-20 MED ORDER — FAMOTIDINE 20 MG PO TABS: 20.0000 mg | ORAL_TABLET | Freq: Once | ORAL | Status: AC

## 2021-12-20 MED ORDER — KETOROLAC TROMETHAMINE 30 MG/ML IJ SOLN
INTRAMUSCULAR | Status: DC | PRN
  Administered 2021-12-20: 30 mg via INTRAVENOUS

## 2021-12-20 MED ORDER — DOCUSATE SODIUM 100 MG PO CAPS: 100.0000 mg | ORAL_CAPSULE | Freq: Two times a day (BID) | ORAL | 0 refills | Status: AC

## 2021-12-20 MED ORDER — GABAPENTIN 300 MG PO CAPS: 300.0000 mg | ORAL_CAPSULE | ORAL | Status: AC

## 2021-12-20 MED ORDER — ONDANSETRON 4 MG PO TBDP: 4.0000 mg | ORAL_TABLET | Freq: Three times a day (TID) | ORAL | 0 refills | Status: AC | PRN

## 2021-12-20 MED ORDER — FENTANYL CITRATE (PF) 100 MCG/2ML IJ SOLN
INTRAMUSCULAR | Status: AC
  Administered 2021-12-20: 25 ug via INTRAVENOUS
  Filled 2021-12-20: qty 2

## 2021-12-20 MED ORDER — LIDOCAINE HCL (PF) 2 % IJ SOLN
INTRAMUSCULAR | Status: AC
  Filled 2021-12-20: qty 5

## 2021-12-20 MED ORDER — DEXAMETHASONE SODIUM PHOSPHATE 10 MG/ML IJ SOLN
INTRAMUSCULAR | Status: AC
  Filled 2021-12-20: qty 1

## 2021-12-20 MED ORDER — GABAPENTIN 800 MG PO TABS: 800.0000 mg | ORAL_TABLET | Freq: Every day | ORAL | 0 refills | Status: AC

## 2021-12-20 MED ORDER — ROCURONIUM BROMIDE 100 MG/10ML IV SOLN
INTRAVENOUS | Status: DC | PRN
  Administered 2021-12-20: 30 mg via INTRAVENOUS
  Administered 2021-12-20: 50 mg via INTRAVENOUS
  Administered 2021-12-20: 20 mg via INTRAVENOUS

## 2021-12-20 MED ORDER — ONDANSETRON HCL 4 MG/2ML IJ SOLN
INTRAMUSCULAR | Status: DC | PRN
  Administered 2021-12-20: 4 mg via INTRAVENOUS

## 2021-12-20 MED ORDER — OXYCODONE HCL 5 MG PO TABS
ORAL_TABLET | ORAL | Status: AC
  Administered 2021-12-20: 5 mg via ORAL
  Filled 2021-12-20: qty 1

## 2021-12-20 MED ORDER — SEVOFLURANE IN SOLN
RESPIRATORY_TRACT | Status: AC
  Filled 2021-12-20: qty 250

## 2021-12-20 MED ORDER — OXYCODONE HCL 5 MG PO TABS: 5.0000 mg | ORAL_TABLET | ORAL | 0 refills | Status: AC | PRN

## 2021-12-20 MED ORDER — MIDAZOLAM HCL 2 MG/2ML IJ SOLN
INTRAMUSCULAR | Status: DC | PRN
  Administered 2021-12-20: 2 mg via INTRAVENOUS

## 2021-12-20 MED ORDER — DEXAMETHASONE SODIUM PHOSPHATE 10 MG/ML IJ SOLN
INTRAMUSCULAR | Status: DC | PRN
  Administered 2021-12-20: 10 mg via INTRAVENOUS

## 2021-12-20 MED ORDER — GABAPENTIN 300 MG PO CAPS
ORAL_CAPSULE | ORAL | Status: AC
  Administered 2021-12-20: 300 mg via ORAL
  Filled 2021-12-20: qty 1

## 2021-12-20 MED ORDER — ORAL CARE MOUTH RINSE: 15.0000 mL | Freq: Once | OROMUCOSAL | Status: AC

## 2021-12-20 MED ORDER — FENTANYL CITRATE (PF) 100 MCG/2ML IJ SOLN
25.0000 ug | INTRAMUSCULAR | Status: DC | PRN
  Administered 2021-12-20: 25 ug via INTRAVENOUS

## 2021-12-20 MED ORDER — LIDOCAINE HCL (CARDIAC) PF 100 MG/5ML IV SOSY
PREFILLED_SYRINGE | INTRAVENOUS | Status: DC | PRN
  Administered 2021-12-20: 100 mg via INTRAVENOUS

## 2021-12-20 MED ORDER — CEFAZOLIN SODIUM-DEXTROSE 2-4 GM/100ML-% IV SOLN
INTRAVENOUS | Status: AC
  Filled 2021-12-20: qty 100

## 2021-12-20 MED ORDER — ACETAMINOPHEN 500 MG PO TABS
ORAL_TABLET | ORAL | Status: AC
  Administered 2021-12-20: 1000 mg via ORAL
  Filled 2021-12-20: qty 2

## 2021-12-20 MED ORDER — FUROSEMIDE 10 MG/ML IJ SOLN
INTRAMUSCULAR | Status: AC
  Filled 2021-12-20: qty 4

## 2021-12-20 MED ORDER — SUGAMMADEX SODIUM 200 MG/2ML IV SOLN
INTRAVENOUS | Status: DC | PRN
  Administered 2021-12-20: 200 mg via INTRAVENOUS

## 2021-12-20 MED ORDER — OXYCODONE HCL 5 MG PO TABS: 5.0000 mg | ORAL_TABLET | Freq: Once | ORAL | Status: AC

## 2021-12-20 MED ORDER — POVIDONE-IODINE 10 % EX SWAB
2.0000 | Freq: Once | CUTANEOUS | Status: AC
  Administered 2021-12-20: 2 via TOPICAL

## 2021-12-20 MED ORDER — FUROSEMIDE 10 MG/ML IJ SOLN
INTRAMUSCULAR | Status: DC | PRN
  Administered 2021-12-20: 10 mg via INTRAMUSCULAR

## 2021-12-20 MED ORDER — ACETAMINOPHEN EXTRA STRENGTH 500 MG PO TABS: 1000.0000 mg | ORAL_TABLET | Freq: Four times a day (QID) | ORAL | 0 refills | Status: AC

## 2021-12-20 MED ORDER — CHLORHEXIDINE GLUCONATE 0.12 % MT SOLN
OROMUCOSAL | Status: AC
  Administered 2021-12-20: 15 mL via OROMUCOSAL
  Filled 2021-12-20: qty 15

## 2021-12-20 MED ORDER — CHLORHEXIDINE GLUCONATE 0.12 % MT SOLN: 15.0000 mL | Freq: Once | OROMUCOSAL | Status: AC

## 2021-12-20 MED ORDER — PROMETHAZINE HCL 25 MG/ML IJ SOLN: 6.2500 mg | INTRAMUSCULAR | Status: DC | PRN

## 2021-12-20 MED ORDER — CEFAZOLIN SODIUM-DEXTROSE 2-4 GM/100ML-% IV SOLN
2.0000 g | INTRAVENOUS | Status: AC
  Administered 2021-12-20: 2 g via INTRAVENOUS

## 2021-12-20 MED ORDER — KETOROLAC TROMETHAMINE 30 MG/ML IJ SOLN
INTRAMUSCULAR | Status: AC
  Filled 2021-12-20: qty 1

## 2021-12-20 MED ORDER — FLUORESCEIN SODIUM 10 % IV SOLN
INTRAVENOUS | Status: AC
  Filled 2021-12-20: qty 5

## 2021-12-20 MED ORDER — IBUPROFEN 800 MG PO TABS: 800.0000 mg | ORAL_TABLET | Freq: Three times a day (TID) | ORAL | 1 refills | Status: AC

## 2021-12-20 SURGICAL SUPPLY — 65 items
ADH SKN CLS APL DERMABOND .7 (GAUZE/BANDAGES/DRESSINGS) ×2
APL SRG 38 LTWT LNG FL B (MISCELLANEOUS)
APPLICATOR ARISTA FLEXITIP XL (MISCELLANEOUS) IMPLANT
BAG DRN RND TRDRP ANRFLXCHMBR (UROLOGICAL SUPPLIES) ×2
BAG URINE DRAIN 2000ML AR STRL (UROLOGICAL SUPPLIES) ×2 IMPLANT
BLADE SURG SZ11 CARB STEEL (BLADE) ×2 IMPLANT
CANNULA CAP OBTURATR AIRSEAL 8 (CAP) ×2 IMPLANT
CATH FOLEY 2WAY  5CC 16FR (CATHETERS) ×2
CATH FOLEY 2WAY 5CC 16FR (CATHETERS) ×2
CATH URTH 16FR FL 2W BLN LF (CATHETERS) ×2 IMPLANT
COUNTER NEEDLE 20/40 LG (NEEDLE) ×2 IMPLANT
COVER TIP SHEARS 8 DVNC (MISCELLANEOUS) ×2 IMPLANT
COVER TIP SHEARS 8MM DA VINCI (MISCELLANEOUS) ×2
DERMABOND ADVANCED (GAUZE/BANDAGES/DRESSINGS) ×2
DERMABOND ADVANCED .7 DNX12 (GAUZE/BANDAGES/DRESSINGS) ×2 IMPLANT
DRAPE 3/4 80X56 (DRAPES) ×4 IMPLANT
DRAPE ARM DVNC X/XI (DISPOSABLE) ×8 IMPLANT
DRAPE COLUMN DVNC XI (DISPOSABLE) ×2 IMPLANT
DRAPE DA VINCI XI ARM (DISPOSABLE) ×8
DRAPE DA VINCI XI COLUMN (DISPOSABLE) ×2
DRAPE ROBOT W/ LEGGING 30X125 (DRAPES) ×2 IMPLANT
ELECT REM PT RETURN 9FT ADLT (ELECTROSURGICAL) ×2
ELECTRODE REM PT RTRN 9FT ADLT (ELECTROSURGICAL) ×2 IMPLANT
GAUZE 4X4 16PLY ~~LOC~~+RFID DBL (SPONGE) ×4 IMPLANT
GLOVE BIO SURGEON STRL SZ7 (GLOVE) ×8 IMPLANT
GLOVE SURG SYN 7.0 (GLOVE) ×2 IMPLANT
GLOVE SURG SYN 7.0 PF PI (GLOVE) ×2 IMPLANT
GLOVE SURG UNDER LTX SZ7.5 (GLOVE) ×8 IMPLANT
GLOVE SURG UNDER POLY LF SZ7.5 (GLOVE) ×2 IMPLANT
GOWN STRL REUS W/ TWL LRG LVL3 (GOWN DISPOSABLE) ×16 IMPLANT
GOWN STRL REUS W/TWL LRG LVL3 (GOWN DISPOSABLE) ×16
GRASPER SUT TROCAR 14GX15 (MISCELLANEOUS) ×2 IMPLANT
HEMOSTAT ARISTA ABSORB 3G PWDR (HEMOSTASIS) IMPLANT
IRRIGATION STRYKERFLOW (MISCELLANEOUS) IMPLANT
IRRIGATOR STRYKERFLOW (MISCELLANEOUS) ×2
IV NS 1000ML (IV SOLUTION) ×2
IV NS 1000ML BAXH (IV SOLUTION) IMPLANT
KIT PINK PAD W/HEAD ARE REST (MISCELLANEOUS) ×2
KIT PINK PAD W/HEAD ARM REST (MISCELLANEOUS) ×2 IMPLANT
KIT TURNOVER CYSTO (KITS) ×2 IMPLANT
LABEL OR SOLS (LABEL) ×2 IMPLANT
MANIFOLD NEPTUNE II (INSTRUMENTS) ×2 IMPLANT
MANIPULATOR VCARE STD CRV RETR (MISCELLANEOUS) IMPLANT
OBTURATOR OPTICAL STANDARD 8MM (TROCAR) ×2
OBTURATOR OPTICAL STND 8 DVNC (TROCAR) ×2
OBTURATOR OPTICALSTD 8 DVNC (TROCAR) ×2 IMPLANT
OCCLUDER COLPOPNEUMO (BALLOONS) ×2 IMPLANT
PACK GYN LAPAROSCOPIC (MISCELLANEOUS) ×2 IMPLANT
PAD OB MATERNITY 4.3X12.25 (PERSONAL CARE ITEMS) ×2 IMPLANT
PAD PREP 24X41 OB/GYN DISP (PERSONAL CARE ITEMS) ×2 IMPLANT
SCISSORS METZENBAUM CVD 33 (INSTRUMENTS) IMPLANT
SCRUB CHG 4% DYNA-HEX 4OZ (MISCELLANEOUS) ×2 IMPLANT
SEAL CANN UNIV 5-8 DVNC XI (MISCELLANEOUS) ×6 IMPLANT
SEAL XI 5MM-8MM UNIVERSAL (MISCELLANEOUS) ×6
SEALER VESSEL DA VINCI XI (MISCELLANEOUS) ×2
SEALER VESSEL EXT DVNC XI (MISCELLANEOUS) ×2 IMPLANT
SET CYSTO W/LG BORE CLAMP LF (SET/KITS/TRAYS/PACK) IMPLANT
SET TUBE FILTERED XL AIRSEAL (SET/KITS/TRAYS/PACK) ×2 IMPLANT
SOLUTION ELECTROLUBE (MISCELLANEOUS) ×2 IMPLANT
SURGILUBE 2OZ TUBE FLIPTOP (MISCELLANEOUS) ×2 IMPLANT
SUT MNCRL 4-0 (SUTURE) ×2
SUT MNCRL 4-0 27XMFL (SUTURE) ×2
SUT VIC AB 0 CT2 27 (SUTURE) ×4 IMPLANT
SUT VLOC 90 2/L VL 12 GS22 (SUTURE) ×2 IMPLANT
SUTURE MNCRL 4-0 27XMF (SUTURE) ×2 IMPLANT

## 2021-12-20 NOTE — Discharge Instructions (Addendum)
Discharge instructions after  robotically-assisted total laparoscopic hysterectomy   For the next three days, take ibuprofen and acetaminophen on a schedule, every 8 hours. You can take them together or you can intersperse them, and take one every four hours. I also gave you gabapentin for nighttime, to help you sleep and also to control pain. Take gabapentin medicines at night for at least the next 3 nights. You also have a narcotic, oxycodone, to take as needed if the above medicines don't help.  Postop constipation is a major cause of pain. Stay well hydrated, walk as you tolerate, and take over the counter senna as well as stool softeners if you need them.   Signs and Symptoms to Report Call our office at [phone] if you have any of the following.   Fever over 100.4 degrees or higher  Severe stomach pain not relieved with pain medications  Bright red bleeding that's heavier than a period that does not slow with rest  To go the bathroom a lot (frequency), you can't hold your urine (urgency), or it hurts when you empty your bladder (urinate)  Chest pain  Shortness of breath  Pain in the calves of your legs  Severe nausea and vomiting not relieved with anti-nausea medications  Signs of infection around your wounds, such as redness, hot to touch, swelling, green/yellow drainage (like pus), bad smelling discharge  Any concerns  What You Can Expect after Surgery  You may see some pink tinged, bloody fluid and bruising around the wound. This is normal.  You may notice shoulder and neck pain. This is caused by the gas used during surgery to expand your abdomen so your surgeon could get to the uterus easier.  You may have a sore throat because of the tube in your mouth during general anesthesia. This will go away in 2 to 3 days.  You may have some stomach cramps.  You may notice spotting on your panties.  You may have pain around the incision sites.   Activities after Your  Discharge Follow these guidelines to help speed your recovery at home:  Do the coughing and deep breathing as you did in the hospital for 2 weeks. Use the small blue breathing device, called the incentive spirometer for 2 weeks.  Don't drive if you are in pain or taking narcotic pain medicine. You may drive when you can safely slam on the brakes, turn the wheel forcefully, and rotate your torso comfortably. This is typically 1-2 weeks. Practice in a parking lot or side street prior to attempting to drive regularly.   Ask others to help with household chores for 4 weeks.  Do not lift anything heavier that 10 pounds for 4-6 weeks. This includes pets, children, and groceries.  Don't do strenuous activities, exercises, or sports like vacuuming, tennis, squash, etc. until your doctor says it is safe to do so. ---Maintain pelvic rest for 12 weeks. This means nothing in the vagina or rectum at all (no douching, tampons, intercourse) for 12 weeks.   Walk as you feel able. Rest often since it may take two or three weeks for your energy level to return to normal.   You may climb stairs  Avoid constipation:   -Eat fruits, vegetables, and whole grains. Eat small meals as your appetite will take time to return to normal.   -Drink 6 to 8 glasses of water each day unless your doctor has told you to limit your fluids.   -Use a laxative or   stool softener as needed if constipation becomes a problem. You may take Miralax, metamucil, Citrucil, Colace, Senekot, FiberCon, etc. If this does not relieve the constipation, try two tablespoons of Milk Of Magnesia every 8 hours until your bowels move.   You may shower. Gently wash the wounds with a mild soap and water. Pat dry.  Do not get in a hot tub, swimming pool, etc. for 6 weeks.  Do not use lotions, oils, powders on the wounds.  Do not douche, use tampons, or have sex until your doctor says it is okay.  Take your pain medicine when you need it. The medicine may not  work as well if the pain is bad.  Take the medicines you were taking before surgery. Other medications you will need are pain medications (Norco or Percocet) and nausea medications (Zofran).   Here is a helpful article from the website [employer], regarding constipation  Here are reasons why constipation occurs after surgery: 1) During the operation and in the recovery room, most people are given opioid pain medication, primarily through an IV, to treat moderate or severe pain. Intravenous opioids include morphine, Dilaudid and fentanyl. After surgery, patients are often prescribed opioid pain medication to take by mouth at home, including codeine, Vicodin, Norco, and Percocet. All of these medications cause constipation by slowing down the movement of your intestine. 2) Changes in your diet before surgery can be another culprit. It is common to get specific instructions to change how you normally eat or drink before your surgery, like only having liquids the day before or not having anything to eat or drink after midnight the night before surgery. For this reason, temporary dehydration may occur. This, along with not eating or only having liquids, means that you are getting less fiber than usual. Both these factors contribute to constipation. 3) Changes in your diet after surgery can also contribute to the problem. Although many people don't have dietary restrictions after operations, being under anesthesia can make you lose your appetite for several hours and maybe even days. Some people can even have nausea or vomiting. Not eating or drinking normally means that you are not getting enough fiber and you can get dehydrated, both leading to constipation. 4) Lying in a bed more than usual--which happens before, during and after surgery--combined with the medications and diet changes, all work together to slow down your colon and make your poop turn to rock.  No one likes to be constipated.  Let's face  it, it's not a pleasant feeling when you don't poop for days, then strain on the toilet to finally pass something large enough to cause damage. An ounce of prevention is worth a pound of cure, so: Assume you will be constipated. Plan and prepare accordingly. Post-surgery is one of those unique situations where the temporary use of laxatives can make a world of difference. Always consult with your doctor, and recognize that if you wait several days after surgery to take a laxative, the constipation might be too severe for these over-the-counter options. It is always important to discuss all medications you plan on taking with your doctor. Ask your doctor if you can start the laxative immediately after surgery. *  Here are go-to post-surgery laxatives: Senna: Senna is an herb that acts as a "stimulant laxative," meaning it increases the activity of the intestine to cause you to have a bowel movement. It comes in many forms, but senna pills are easy to take and are sold over the   counter at almost all pharmacies. Since opioid pain medications slow down the activity of the intestine, it makes sense to take a medication to help reverse that side effect. Long-term use of a stimulant laxative is not a good idea since it can make your colon "lazy" and not function properly; however, temporary use immediately after surgery is acceptable. In general, if you are able to eat a normal diet, taking senna soon after surgery works the best. Senna usually works within hours to produce a bowel movement, but this is less predictable when you are taking different medications after surgery. Try not to wait several days to start taking senna, as often it is too late by then. Just like with all medications or supplements, check with your doctor before starting new treatment.   Magnesium: Magnesium is an important mineral that our body needs. We get magnesium from some foods that we eat, especially foods that are high in fiber such  as broccoli, almonds and whole grains. There are also magnesium-based medications used to treat constipation including milk of magnesia (magnesium hydroxide), magnesium citrate and magnesium oxide. They work by drawing water into the intestine, putting it into the class of "osmotic" laxatives. Magnesium products in low doses appear to be safe, but if taken in very large doses, can lead to problems such as irregular heartbeat, low blood pressure and even death. It can also affect other medications you might be taking, therefore it is important to discuss using magnesium with your physician and pharmacist before initiating therapy. Most over-the-counter magnesium laxatives work very well to help with the constipation related to surgery, but sometimes they work too well and lead to diarrhea. Make sure you are somewhere with easy access to a bathroom, just in case.   Bisacodyl: Bisacodyl (generic name) is sold under brand names such as Dulcolax. Much like senna, it is a "stimulant laxative," meaning it makes your intestines move more quickly to push out the stool. This is another good choice to start taking as soon as your doctor says you can take a laxative after surgery. It comes in pill form and as a suppository, which is a good choice for people who cannot or are not allowed to swallow pills. Studies have shown that it works as a laxative, but like most of these medications, you should use this on a short-term basis only.   Enema: Enemas strike fear in many people, but FEAR NOT! It's nowhere near as big a deal as you may think. An enema is just a way to get some liquid into your rectum by placing a specially designed device through your anus. If you have never done one, it might seem like a painful, unpleasant, uncomfortable, complicated and lengthy procedure. But in reality, it's simple, takes just a few seconds and is highly effective. The small ready-made bottles you buy at the pharmacy are much easier than  the hose/large rubber container type. Those recommended positions illustrated in some instructions are generally not necessary to place the enema. It's very similar to the insertion of a tampon, requiring a slight squat. Some extra lubrication on the enema's tip (or on your anus) will make it a breeze. In certain cases, there is no substitute for a good enema. For example, if someone has not pooped for a few days, the beginning of the poop waiting to come out can become rock hard. Passing that hard stool can lead to much pain and problems like anal fissures. Inserting a little liquid to   break up the rock-hard stool will help make its passage much easier. Enemas come with different liquids. Most come with saline, but there are also mineral oil options. You can also use warm water in the reusable enema containers. They all work. But since saline can sometimes be irritating, so try a mineral oil or water enema instead.  Here are commonly recommended constipation medications that do not work well for post-surgery constipation: Docusate: Docusate (generic name) most commonly referred to as Colace (brand name) is not really a laxative, but is classified as a stool softener. Although this medication is commonly prescribed, it is not recommended for several reasons: 1) there is no good medical evidence that it works 2) even if it has an effect, which is very questionable, it is minimal and cannot combat the intestinal slowing caused by the opioid medications. Skip docusate to save money and space in your pillbox for something more effective.  PEG: Miralax (brand name) is basically a chemical called polyethylene glycol (PEG) and it has gained tremendous popularity as a laxative. This product is an "osmotic laxative" meaning it works by pulling water into the stool, making it softer. This is very similar to the action of natural fiber in foods and supplements. Therefore, the effect seen by this medication is not  immediate, causing a bowel movement in a day or more. Is this medication strong enough to battle the constipation related to having an operation? Maybe for some people not prone to constipation. But for most people, other laxatives are better to prevent constipation after surgery. AMBULATORY SURGERY  DISCHARGE INSTRUCTIONS   The drugs that you were given will stay in your system until tomorrow so for the next 24 hours you should not:  Drive an automobile Make any legal decisions Drink any alcoholic beverage   You may resume regular meals tomorrow.  Today it is better to start with liquids and gradually work up to solid foods.  You may eat anything you prefer, but it is better to start with liquids, then soup and crackers, and gradually work up to solid foods.   Please notify your doctor immediately if you have any unusual bleeding, trouble breathing, redness and pain at the surgery site, drainage, fever, or pain not relieved by medication.    Additional Instructions:        Please contact your physician with any problems or Same Day Surgery at [phone], Monday through Friday 6 am to 4 pm, or [facility] number at [phone].  

## 2021-12-20 NOTE — Transfer of Care (Signed)
Immediate Anesthesia Transfer of Care Note  Patient: Kristine Lara  Procedure(s) Performed: XI ROBOTIC ASSISTED TOTAL HYSTERECTOMY WITH BILATERAL SALPINGO OOPHORECTOMY (Bilateral) LYSIS OF ADHESION (Abdomen)  Patient Location: PACU  Anesthesia Type:General  Level of Consciousness: drowsy and patient cooperative  Airway & Oxygen Therapy: Patient Spontanous Breathing and Patient connected to face mask oxygen  Post-op Assessment: Report given to RN and Post -op Vital signs reviewed and stable  Post vital signs: Reviewed and stable  Last Vitals:  Vitals Value Taken Time  BP 138/80 12/20/21 1110  Temp 36.1 C 12/20/21 1110  Pulse 66 12/20/21 1112  Resp 11 12/20/21 1112  SpO2 100 % 12/20/21 1112  Vitals shown include unvalidated device data.  Last Pain:  Vitals:   12/20/21 0618  TempSrc: Temporal  PainSc: 0-No pain         Complications: No notable events documented.

## 2021-12-20 NOTE — Anesthesia Preprocedure Evaluation (Signed)
Anesthesia Evaluation  Patient identified by MRN, date of birth, ID band Patient awake    Reviewed: Allergy & Precautions, H&P , NPO status , Patient's Chart, lab work & pertinent test results, reviewed documented beta blocker date and time   History of Anesthesia Complications Negative for: history of anesthetic complications  Airway Mallampati: I  TM Distance: >3 FB Neck ROM: full    Dental  (+) Dental Advidsory Given, Caps, Teeth Intact   Pulmonary neg pulmonary ROS,    Pulmonary exam normal breath sounds clear to auscultation       Cardiovascular Exercise Tolerance: Good negative cardio ROS Normal cardiovascular exam Rhythm:regular Rate:Normal     Neuro/Psych negative neurological ROS  negative psych ROS   GI/Hepatic negative GI ROS, Neg liver ROS,   Endo/Other  negative endocrine ROS  Renal/GU Renal disease (kidney stones)  negative genitourinary   Musculoskeletal   Abdominal   Peds  Hematology negative hematology ROS (+)   Anesthesia Other Findings Past Medical History: No date: Anemia No date: History of kidney stones  Overweight  Reproductive/Obstetrics negative OB ROS                             Anesthesia Physical Anesthesia Plan  ASA: 2  Anesthesia Plan: General   Post-op Pain Management:    Induction: Intravenous  PONV Risk Score and Plan: 3 and Ondansetron, Dexamethasone, Treatment may vary due to age or medical condition, Midazolam and Promethazine  Airway Management Planned: Oral ETT  Additional Equipment:   Intra-op Plan:   Post-operative Plan: Extubation in OR  Informed Consent: I have reviewed the patients History and Physical, chart, labs and discussed the procedure including the risks, benefits and alternatives for the proposed anesthesia with the patient or authorized representative who has indicated his/her understanding and acceptance.      Dental Advisory Given  Plan Discussed with: Anesthesiologist, CRNA and Surgeon  Anesthesia Plan Comments:         Anesthesia Quick Evaluation

## 2021-12-20 NOTE — Interval H&P Note (Signed)
History and Physical Interval Note:  12/20/2021 7:32 AM  Kristine Lara [place]  has presented today for surgery, with the diagnosis of abnormal uterine bleeding.  The various methods of treatment have been discussed with the patient and family. After consideration of risks, benefits and other options for treatment, the patient has consented to  Procedure(s): XI ROBOTIC ASSISTED TOTAL HYSTERECTOMY WITH BILATERAL SALPINGO OOPHORECTOMY (Bilateral) as a surgical intervention.  The patient's history has been reviewed, patient examined, no change in status, stable for surgery.  I have reviewed the patient's chart and labs.  Questions were answered to the patient's satisfaction.     Christeen Douglas

## 2021-12-20 NOTE — Op Note (Signed)
Kristine Lara [place] PROCEDURE DATE: 12/20/2021  PREOPERATIVE DIAGNOSIS: Menometrorrhagia POSTOPERATIVE DIAGNOSIS: The same, +pelvic adhesive disease PROCEDURE:  XI ROBOTIC ASSISTED TOTAL HYSTERECTOMY WITH BILATERAL SALPINGO OOPHORECTOMY:  LYSIS OF ADHESION: [id number]  SURGEON:  Dr. Christeen Douglas, MD ASSISTANT: CST Anesthesiologist:  Anesthesiologist: Lenard Simmer, MD CRNA: Irving Burton, CRNA; Elmarie Mainland, CRNA  INDICATIONS: 46 y.o. F  here for definitive surgical management secondary to the indications listed under preoperative diagnoses; please see preoperative note for further details.  Risks of surgery were discussed with the patient including but not limited to: bleeding which may require transfusion or reoperation; infection which may require antibiotics; injury to bowel, bladder, ureters or other surrounding organs; need for additional procedures; thromboembolic phenomenon, incisional problems and other postoperative/anesthesia complications. Written informed consent was obtained.    FINDINGS:   Pelvic: External genitalia negative for lesions. Vagina negative. Adnexa negative for masses or nodularity. Cervix without gross lesions. Uterus mobile, anteverted, small.   Intraoperative findings revealed a normal upper abdomen including bowel, diaphragmatic surfaces, stomach, and omentum.   Significant pelvic adhesive disease was noted, particularly on the left side.  The left omentum was densely adherent to the left anterior pelvic wall, and the uterus was pulled in this direction.  The left fallopian tube was torsed many times, and scarred tightly to the omentum and the anterior peritoneum.  The sigmoid colon epiploica was also adherent to the underside of the omentum, but this was easily removed without damage to the colon or epiploica.  The left side of the uterus was adherent to the left pelvic sidewall and to the bladder, although not in a standard prior laparotomy fashion.  The  adhesions were removed and normal anatomy is restored to the colon.  ANESTHESIA:    General INTRAVENOUS FLUIDS:1300  ml ESTIMATED BLOOD LOSS:50 ml URINE OUTPUT: 400 ml  SPECIMENS: Uterus, cervix, bilateral fallopian tubes and bilateral ovaries COMPLICATIONS: None immediate  Pelvic adhesive disease increased the duration of this case by 50%, and a modifier 22 is appropriate.   RATLH/BILATERAL SALPINGO-OPHORECTOMY:  PROCEDURE IN DETAIL: After informed consent was obtained, the patient was taken to the operating room where general anesthesia was obtained without difficulty. The patient was positioned in the dorsal lithotomy position in Viborg stirrups and her arms were carefully tucked at her sides and the usual precautions were taken. Deep Trendelenburg (20-25 deg) was established to confirm that she does not shift on the table.  She was prepped and draped in normal sterile fashion.  Time-out was performed and a Foley catheter was placed into the bladder. A standard VCare uterine manipulator was then placed in the uterus without incident.  Preoperative prophylactic antibiotics were given through her iv.  After infiltration of local anesthetic at the proposed trocar sites, an 8 mm incision was created at the umbilicus, and an AirSeal 85mm was placed under direct visualization, after confirmation of OG tube working well. Pneumoperitoneum was created to a pressure of 15 mm Hg. The camera was placed and the abdomin surveyed, noting intact bowel below the site of entry. A survey of the pelvis and upper abdomen revealed the above findings. Two right and left lateral 8-mm robotic ports were placed under direct visualization.  The patient was placed in deepTrendelenburg and the bowel was displaced up into the upper abdomen. The robot was left side docked. The instruments were placed under direct visualization.   Attention was turned to the pelvic adhesive disease.  Using careful sharp and blunt  dissection, the omentum was  removed from the left pelvic sidewall to reveal the findings above.  The left sigmoid colon epiploica was carefully removed, and the left fallopian tube needed to be divided from the omentum to free it from the adnexa.  After restoring normal anatomy, the portion of the tube that was interlocking with the omentum was removed and sent with the specimen.  The left sigmoid colon was adherent to the pelvic sidewall, and retroperitoneal dissection did not reveal the ureter.  The colon was not able to be safely removed from this area, and therefore care was taken with every bite to be certain that the ureter was not involved.  The left retroperitoneal space was examined, and the left ovary was carefully dissected out before the IP was divided.  The left side of the uterus was taken down to the internal os, and a bladder flap developed on this side.  The right ureter was identified bilaterally coursing outside of the operative field.   Round ligaments were divided on the right side side with the EndoShears and the retroperitoneal space was opened bilaterally. The posterior leaflet of the broad was taken down to the level of the IP ligament. The anterior leaflet of the broad ligament was carefully taken down to the midline.  A bladder flap was created and the bladder was dissected down off the lower uterine segment and cervix using endoshears and electrocautery.   On the right side, ovariolysis was performed and the ovary was dissected medially with care to avoid the ureter.  The infundibulopelvic ligaments were skeletonized, sealed and divided. The peritoneum was taken down to the level of the internal os, and the uterine arteries skeletonized. With strong cephalad pressure from the V-care, bipolar cautery was used to seal and transect the uterine arteries, and the pedicles allowed to fall away laterally.  A colpotomy was performed circumferentially along the V-Care ring with  monopolar electrocautery and the cervix was incised from the vagina using the laparoscopic scissors. The specimen was removed through the vagina.  A pneumo balloon was placed in the vagina and the vaginal cuff was then closed in a running continuous fashion using the  0 V-Lock suture x2 with careful attention to include the vaginal cuff angles, the uterosacral ligaments and the vaginal mucosa within the closure.  Hemostasis was secured with intraabdominal pressure and review of all surgical sites. The intraperitoneal pressure was dropped, and all planes of dissection, vascular pedicles and the vaginal cuff were found to be hemostatic.    The robot was undocked. The lateral trocars were removed under visualization.  The CO2 gas was released and several deep breaths given to remove any remaining CO2 from the peritoneal cavity.  The skin incisions were closed with 4-0 Monocryl subcuticular stitch and Dermabond.   A cystoscopy was performed to ensure that no damage occurred to either ureter or the bladder.  Fluorescein 50 mg was given, and 10 mg of Lasix to assure both ureters were functioning properly.  Excellent E flux was noted from both ureteral orifices.   Anesthesia was reversed without difficulty.  The patient tolerated the procedure well.  Sponge, lap and needle counts were correct x2.  The patient was taken to recovery room in excellent condition.

## 2021-12-20 NOTE — Anesthesia Procedure Notes (Signed)
Procedure Name: Intubation Date/Time: 12/20/2021 7:51 AM  Performed by: Irving Burton, CRNAPre-anesthesia Checklist: Patient identified, Patient being monitored, Timeout performed, Emergency Drugs available and Suction available Patient Re-evaluated:Patient Re-evaluated prior to induction Oxygen Delivery Method: Circle system utilized Preoxygenation: Pre-oxygenation with 100% oxygen Induction Type: IV induction Ventilation: Mask ventilation without difficulty Laryngoscope Size: 3 and McGraph Grade View: Grade I Tube type: Oral Tube size: 7.0 mm Number of attempts: 1 Airway Equipment and Method: Stylet and Video-laryngoscopy Placement Confirmation: ETT inserted through vocal cords under direct vision, positive ETCO2 and breath sounds checked- equal and bilateral Secured at: 21 cm Tube secured with: Tape Dental Injury: Teeth and Oropharynx as per pre-operative assessment

## 2021-12-21 ENCOUNTER — Encounter: Payer: Self-pay | Admitting: Obstetrics and Gynecology

## 2021-12-21 LAB — SURGICAL PATHOLOGY

## 2021-12-25 NOTE — Anesthesia Postprocedure Evaluation (Signed)
Anesthesia Post Note  Patient: Kristine Lara  Procedure(s) Performed: XI ROBOTIC ASSISTED TOTAL HYSTERECTOMY WITH BILATERAL SALPINGO OOPHORECTOMY (Bilateral) LYSIS OF ADHESION (Abdomen)  Patient location during evaluation: PACU Anesthesia Type: General Level of consciousness: awake and alert Pain management: pain level controlled Vital Signs Assessment: post-procedure vital signs reviewed and stable Respiratory status: spontaneous breathing, nonlabored ventilation, respiratory function stable and patient connected to nasal cannula oxygen Cardiovascular status: blood pressure returned to baseline and stable Postop Assessment: no apparent nausea or vomiting Anesthetic complications: no   No notable events documented.   Last Vitals:  Vitals:   12/20/21 1215 12/20/21 1227  BP: 138/74 (!) 144/72  Pulse: 66 (!) 58  Resp: 16 14  Temp: (!) 36.1 C   SpO2: 100% 100%    Last Pain:  Vitals:   12/20/21 1227  TempSrc: Temporal  PainSc:                  Lenard Simmer

## 2022-01-22 ENCOUNTER — Other Ambulatory Visit: Payer: Self-pay | Admitting: Obstetrics and Gynecology

## 2022-01-22 DIAGNOSIS — Z1231 Encounter for screening mammogram for malignant neoplasm of breast: Secondary | ICD-10-CM

## 2022-02-07 ENCOUNTER — Ambulatory Visit
Admission: RE | Admit: 2022-02-07 | Discharge: 2022-02-07 | Disposition: A | Discharge status: Home / Self Care | Payer: 59 | Source: Ambulatory Visit | Attending: Obstetrics and Gynecology | Admitting: Obstetrics and Gynecology

## 2022-02-07 DIAGNOSIS — Z1231 Encounter for screening mammogram for malignant neoplasm of breast: Secondary | ICD-10-CM | POA: Insufficient documentation

## 2022-03-28 ENCOUNTER — Encounter: Payer: Self-pay | Admitting: Hematology and Oncology

## 2022-03-30 ENCOUNTER — Encounter: Payer: Self-pay | Admitting: Hematology and Oncology

## 2022-05-22 DIAGNOSIS — Z131 Encounter for screening for diabetes mellitus: Secondary | ICD-10-CM | POA: Diagnosis not present

## 2022-05-22 DIAGNOSIS — Z862 Personal history of diseases of the blood and blood-forming organs and certain disorders involving the immune mechanism: Secondary | ICD-10-CM | POA: Diagnosis not present

## 2022-05-22 DIAGNOSIS — Z13220 Encounter for screening for lipoid disorders: Secondary | ICD-10-CM | POA: Diagnosis not present

## 2022-05-22 DIAGNOSIS — Z833 Family history of diabetes mellitus: Secondary | ICD-10-CM | POA: Diagnosis not present

## 2022-05-22 DIAGNOSIS — Z0000 Encounter for general adult medical examination without abnormal findings: Secondary | ICD-10-CM | POA: Diagnosis not present

## 2022-10-19 DIAGNOSIS — J069 Acute upper respiratory infection, unspecified: Secondary | ICD-10-CM | POA: Diagnosis not present

## 2022-10-19 DIAGNOSIS — J039 Acute tonsillitis, unspecified: Secondary | ICD-10-CM | POA: Diagnosis not present

## 2022-10-19 DIAGNOSIS — H9202 Otalgia, left ear: Secondary | ICD-10-CM | POA: Diagnosis not present

## 2022-10-24 DIAGNOSIS — Z1231 Encounter for screening mammogram for malignant neoplasm of breast: Secondary | ICD-10-CM | POA: Diagnosis not present

## 2022-10-24 DIAGNOSIS — Z01419 Encounter for gynecological examination (general) (routine) without abnormal findings: Secondary | ICD-10-CM | POA: Diagnosis not present

## 2022-10-24 DIAGNOSIS — M5431 Sciatica, right side: Secondary | ICD-10-CM | POA: Diagnosis not present

## 2022-11-29 DIAGNOSIS — M722 Plantar fascial fibromatosis: Secondary | ICD-10-CM | POA: Diagnosis not present

## 2022-12-03 DIAGNOSIS — M216X1 Other acquired deformities of right foot: Secondary | ICD-10-CM | POA: Diagnosis not present

## 2022-12-03 DIAGNOSIS — M722 Plantar fascial fibromatosis: Secondary | ICD-10-CM | POA: Diagnosis not present

## 2022-12-03 DIAGNOSIS — M7731 Calcaneal spur, right foot: Secondary | ICD-10-CM | POA: Diagnosis not present

## 2022-12-03 DIAGNOSIS — M216X2 Other acquired deformities of left foot: Secondary | ICD-10-CM | POA: Diagnosis not present

## 2022-12-03 DIAGNOSIS — M79671 Pain in right foot: Secondary | ICD-10-CM | POA: Diagnosis not present

## 2022-12-10 ENCOUNTER — Encounter: Payer: Self-pay | Admitting: Hematology and Oncology

## 2022-12-10 ENCOUNTER — Other Ambulatory Visit: Payer: Self-pay

## 2022-12-10 MED ORDER — ESTRADIOL 0.025 MG/24HR TD PTWK
0.0250 mg | MEDICATED_PATCH | TRANSDERMAL | 11 refills | Status: AC
  Filled 2022-12-10: qty 4, 28d supply, fill #0
  Filled 2023-01-05 – 2023-01-16 (×2): qty 4, 28d supply, fill #1
  Filled 2023-03-03: qty 4, 28d supply, fill #2
  Filled 2023-06-26: qty 4, 28d supply, fill #3

## 2022-12-20 DIAGNOSIS — M6281 Muscle weakness (generalized): Secondary | ICD-10-CM | POA: Diagnosis not present

## 2022-12-20 DIAGNOSIS — G8929 Other chronic pain: Secondary | ICD-10-CM | POA: Diagnosis not present

## 2022-12-20 DIAGNOSIS — M5431 Sciatica, right side: Secondary | ICD-10-CM | POA: Diagnosis not present

## 2022-12-20 DIAGNOSIS — M5441 Lumbago with sciatica, right side: Secondary | ICD-10-CM | POA: Diagnosis not present

## 2022-12-25 DIAGNOSIS — M6281 Muscle weakness (generalized): Secondary | ICD-10-CM | POA: Diagnosis not present

## 2022-12-25 DIAGNOSIS — G8929 Other chronic pain: Secondary | ICD-10-CM | POA: Diagnosis not present

## 2022-12-25 DIAGNOSIS — M5431 Sciatica, right side: Secondary | ICD-10-CM | POA: Diagnosis not present

## 2022-12-25 DIAGNOSIS — M5441 Lumbago with sciatica, right side: Secondary | ICD-10-CM | POA: Diagnosis not present

## 2023-01-01 DIAGNOSIS — M5431 Sciatica, right side: Secondary | ICD-10-CM | POA: Diagnosis not present

## 2023-01-01 DIAGNOSIS — G8929 Other chronic pain: Secondary | ICD-10-CM | POA: Diagnosis not present

## 2023-01-01 DIAGNOSIS — M5441 Lumbago with sciatica, right side: Secondary | ICD-10-CM | POA: Diagnosis not present

## 2023-01-01 DIAGNOSIS — M6281 Muscle weakness (generalized): Secondary | ICD-10-CM | POA: Diagnosis not present

## 2023-01-06 ENCOUNTER — Other Ambulatory Visit: Payer: Self-pay

## 2023-01-08 ENCOUNTER — Other Ambulatory Visit: Payer: Self-pay

## 2023-01-14 DIAGNOSIS — M722 Plantar fascial fibromatosis: Secondary | ICD-10-CM | POA: Diagnosis not present

## 2023-01-14 DIAGNOSIS — M79671 Pain in right foot: Secondary | ICD-10-CM | POA: Diagnosis not present

## 2023-01-14 DIAGNOSIS — M7731 Calcaneal spur, right foot: Secondary | ICD-10-CM | POA: Diagnosis not present

## 2023-01-14 DIAGNOSIS — M216X1 Other acquired deformities of right foot: Secondary | ICD-10-CM | POA: Diagnosis not present

## 2023-01-14 DIAGNOSIS — M216X2 Other acquired deformities of left foot: Secondary | ICD-10-CM | POA: Diagnosis not present

## 2023-01-16 ENCOUNTER — Other Ambulatory Visit: Payer: Self-pay

## 2023-02-06 DIAGNOSIS — Z79890 Hormone replacement therapy (postmenopausal): Secondary | ICD-10-CM | POA: Diagnosis not present

## 2023-02-06 DIAGNOSIS — R030 Elevated blood-pressure reading, without diagnosis of hypertension: Secondary | ICD-10-CM | POA: Diagnosis not present

## 2023-02-06 DIAGNOSIS — G4440 Drug-induced headache, not elsewhere classified, not intractable: Secondary | ICD-10-CM | POA: Diagnosis not present

## 2023-02-06 DIAGNOSIS — Z79899 Other long term (current) drug therapy: Secondary | ICD-10-CM | POA: Diagnosis not present

## 2023-02-11 DIAGNOSIS — M216X1 Other acquired deformities of right foot: Secondary | ICD-10-CM | POA: Diagnosis not present

## 2023-02-11 DIAGNOSIS — M216X2 Other acquired deformities of left foot: Secondary | ICD-10-CM | POA: Diagnosis not present

## 2023-02-11 DIAGNOSIS — M79671 Pain in right foot: Secondary | ICD-10-CM | POA: Diagnosis not present

## 2023-02-11 DIAGNOSIS — M7731 Calcaneal spur, right foot: Secondary | ICD-10-CM | POA: Diagnosis not present

## 2023-02-11 DIAGNOSIS — M722 Plantar fascial fibromatosis: Secondary | ICD-10-CM | POA: Diagnosis not present

## 2023-02-28 ENCOUNTER — Other Ambulatory Visit: Payer: Self-pay

## 2023-02-28 MED ORDER — LOSARTAN POTASSIUM 25 MG PO TABS
25.0000 mg | ORAL_TABLET | Freq: Every day | ORAL | 1 refills | Status: AC
  Filled 2023-02-28: qty 90, 90d supply, fill #0
  Filled 2023-06-26: qty 90, 90d supply, fill #1

## 2023-03-03 ENCOUNTER — Other Ambulatory Visit: Payer: Self-pay

## 2023-04-18 ENCOUNTER — Encounter: Payer: Self-pay | Admitting: Hematology and Oncology

## 2023-04-18 ENCOUNTER — Other Ambulatory Visit: Payer: Self-pay

## 2023-04-18 DIAGNOSIS — M5441 Lumbago with sciatica, right side: Secondary | ICD-10-CM | POA: Diagnosis not present

## 2023-04-18 DIAGNOSIS — I10 Essential (primary) hypertension: Secondary | ICD-10-CM | POA: Diagnosis not present

## 2023-04-18 DIAGNOSIS — Z79899 Other long term (current) drug therapy: Secondary | ICD-10-CM | POA: Diagnosis not present

## 2023-04-18 DIAGNOSIS — Z1331 Encounter for screening for depression: Secondary | ICD-10-CM | POA: Diagnosis not present

## 2023-04-18 MED ORDER — PREDNISONE 10 MG PO TABS
ORAL_TABLET | ORAL | 0 refills | Status: DC
  Filled 2023-04-18: qty 21, 6d supply, fill #0

## 2023-04-18 MED ORDER — TIZANIDINE HCL 4 MG PO TABS
4.0000 mg | ORAL_TABLET | Freq: Every evening | ORAL | 0 refills | Status: AC | PRN
  Filled 2023-04-18: qty 10, 10d supply, fill #0

## 2023-07-21 ENCOUNTER — Encounter: Payer: Self-pay | Admitting: Hematology and Oncology

## 2023-07-21 ENCOUNTER — Other Ambulatory Visit: Payer: Self-pay

## 2023-07-21 DIAGNOSIS — H579 Unspecified disorder of eye and adnexa: Secondary | ICD-10-CM | POA: Diagnosis not present

## 2023-07-21 DIAGNOSIS — N390 Urinary tract infection, site not specified: Secondary | ICD-10-CM | POA: Diagnosis not present

## 2023-07-21 DIAGNOSIS — J301 Allergic rhinitis due to pollen: Secondary | ICD-10-CM | POA: Diagnosis not present

## 2023-07-21 DIAGNOSIS — Z1231 Encounter for screening mammogram for malignant neoplasm of breast: Secondary | ICD-10-CM | POA: Diagnosis not present

## 2023-07-21 MED ORDER — AZELASTINE HCL 0.1 % NA SOLN
1.0000 | Freq: Two times a day (BID) | NASAL | 0 refills | Status: AC
  Filled 2023-07-21: qty 30, 30d supply, fill #0

## 2023-07-21 MED ORDER — CETIRIZINE HCL 10 MG PO TABS
10.0000 mg | ORAL_TABLET | Freq: Every day | ORAL | 1 refills | Status: AC
  Filled 2023-07-21: qty 21, 21d supply, fill #0
  Filled 2023-07-21: qty 9, 9d supply, fill #0

## 2023-07-21 MED ORDER — OLOPATADINE HCL 0.2 % OP SOLN
1.0000 [drp] | Freq: Every day | OPHTHALMIC | 0 refills | Status: AC
  Filled 2023-07-21: qty 2.5, 50d supply, fill #0

## 2023-07-21 MED ORDER — NITROFURANTOIN MONOHYD MACRO 100 MG PO CAPS
100.0000 mg | ORAL_CAPSULE | Freq: Two times a day (BID) | ORAL | 0 refills | Status: AC
Start: 2023-07-21 — End: ?
  Filled 2023-07-21: qty 8, 4d supply, fill #0
  Filled 2023-07-21: qty 6, 3d supply, fill #0

## 2023-07-21 MED ORDER — IPRATROPIUM BROMIDE 0.03 % NA SOLN
2.0000 | Freq: Two times a day (BID) | NASAL | 0 refills | Status: DC
  Filled 2023-07-21: qty 30, 75d supply, fill #0

## 2023-07-30 ENCOUNTER — Other Ambulatory Visit: Payer: Self-pay

## 2023-07-30 DIAGNOSIS — I10 Essential (primary) hypertension: Secondary | ICD-10-CM | POA: Diagnosis not present

## 2023-07-30 DIAGNOSIS — J301 Allergic rhinitis due to pollen: Secondary | ICD-10-CM | POA: Diagnosis not present

## 2023-07-30 MED ORDER — MONTELUKAST SODIUM 10 MG PO TABS
10.0000 mg | ORAL_TABLET | Freq: Every day | ORAL | 3 refills | Status: AC
  Filled 2023-07-30: qty 30, 30d supply, fill #0
  Filled 2024-01-16: qty 90, 90d supply, fill #1

## 2023-07-30 MED ORDER — PREDNISONE 20 MG PO TABS
20.0000 mg | ORAL_TABLET | Freq: Every day | ORAL | 0 refills | Status: DC
  Filled 2023-07-30: qty 4, 4d supply, fill #0

## 2023-08-08 ENCOUNTER — Other Ambulatory Visit: Payer: Self-pay

## 2023-08-08 ENCOUNTER — Other Ambulatory Visit: Payer: Self-pay | Admitting: Nurse Practitioner

## 2023-08-08 DIAGNOSIS — Z1231 Encounter for screening mammogram for malignant neoplasm of breast: Secondary | ICD-10-CM | POA: Diagnosis not present

## 2023-08-08 DIAGNOSIS — E669 Obesity, unspecified: Secondary | ICD-10-CM | POA: Diagnosis not present

## 2023-08-08 DIAGNOSIS — Z79890 Hormone replacement therapy (postmenopausal): Secondary | ICD-10-CM | POA: Diagnosis not present

## 2023-08-08 DIAGNOSIS — I10 Essential (primary) hypertension: Secondary | ICD-10-CM | POA: Diagnosis not present

## 2023-08-08 DIAGNOSIS — R7302 Impaired glucose tolerance (oral): Secondary | ICD-10-CM | POA: Diagnosis not present

## 2023-08-08 DIAGNOSIS — J301 Allergic rhinitis due to pollen: Secondary | ICD-10-CM | POA: Diagnosis not present

## 2023-08-08 DIAGNOSIS — Z862 Personal history of diseases of the blood and blood-forming organs and certain disorders involving the immune mechanism: Secondary | ICD-10-CM | POA: Diagnosis not present

## 2023-08-08 DIAGNOSIS — R232 Flushing: Secondary | ICD-10-CM | POA: Diagnosis not present

## 2023-08-08 DIAGNOSIS — Z0000 Encounter for general adult medical examination without abnormal findings: Secondary | ICD-10-CM | POA: Diagnosis not present

## 2023-08-08 DIAGNOSIS — Z79899 Other long term (current) drug therapy: Secondary | ICD-10-CM | POA: Diagnosis not present

## 2023-08-08 MED ORDER — ESTRADIOL 0.025 MG/24HR TD PTWK
0.0250 mg | MEDICATED_PATCH | TRANSDERMAL | 3 refills | Status: AC
  Filled 2023-08-08: qty 12, 84d supply, fill #0

## 2023-08-08 MED ORDER — METFORMIN HCL ER 500 MG PO TB24
500.0000 mg | ORAL_TABLET | ORAL | 1 refills | Status: DC
  Filled 2023-08-08: qty 90, 52d supply, fill #0
  Filled 2023-11-07: qty 90, 52d supply, fill #1

## 2023-08-25 ENCOUNTER — Ambulatory Visit
Admission: RE | Admit: 2023-08-25 | Discharge: 2023-08-25 | Disposition: A | Discharge status: Home / Self Care | Source: Ambulatory Visit | Attending: Nurse Practitioner | Admitting: Nurse Practitioner

## 2023-08-25 DIAGNOSIS — Z1231 Encounter for screening mammogram for malignant neoplasm of breast: Secondary | ICD-10-CM | POA: Diagnosis not present

## 2023-09-02 DIAGNOSIS — M79671 Pain in right foot: Secondary | ICD-10-CM | POA: Diagnosis not present

## 2023-09-02 DIAGNOSIS — M722 Plantar fascial fibromatosis: Secondary | ICD-10-CM | POA: Diagnosis not present

## 2023-09-02 DIAGNOSIS — M216X2 Other acquired deformities of left foot: Secondary | ICD-10-CM | POA: Diagnosis not present

## 2023-09-02 DIAGNOSIS — M216X1 Other acquired deformities of right foot: Secondary | ICD-10-CM | POA: Diagnosis not present

## 2023-09-02 DIAGNOSIS — M7731 Calcaneal spur, right foot: Secondary | ICD-10-CM | POA: Diagnosis not present

## 2023-10-02 ENCOUNTER — Other Ambulatory Visit: Payer: Self-pay

## 2023-10-02 MED ORDER — LOSARTAN POTASSIUM 25 MG PO TABS
25.0000 mg | ORAL_TABLET | Freq: Every day | ORAL | 1 refills | Status: AC
Start: 2023-10-02 — End: ?
  Filled 2023-10-02: qty 90, 90d supply, fill #0
  Filled 2024-01-16: qty 90, 90d supply, fill #1

## 2023-10-07 ENCOUNTER — Other Ambulatory Visit: Payer: Self-pay

## 2023-10-31 ENCOUNTER — Other Ambulatory Visit: Payer: Self-pay

## 2023-10-31 MED ORDER — ESTRADIOL 0.025 MG/24HR TD PTWK
0.0250 mg | MEDICATED_PATCH | TRANSDERMAL | 1 refills | Status: AC
  Filled 2023-10-31: qty 12, 84d supply, fill #0

## 2023-10-31 MED ORDER — LOSARTAN POTASSIUM 25 MG PO TABS
25.0000 mg | ORAL_TABLET | Freq: Every day | ORAL | 1 refills | Status: AC
  Filled 2023-10-31: qty 90, 90d supply, fill #0

## 2023-11-07 ENCOUNTER — Other Ambulatory Visit: Payer: Self-pay

## 2023-11-17 ENCOUNTER — Encounter: Payer: Self-pay | Admitting: Hematology and Oncology

## 2023-11-17 ENCOUNTER — Other Ambulatory Visit: Payer: Self-pay

## 2023-11-17 DIAGNOSIS — U071 COVID-19: Secondary | ICD-10-CM | POA: Diagnosis not present

## 2023-11-17 DIAGNOSIS — I10 Essential (primary) hypertension: Secondary | ICD-10-CM | POA: Diagnosis not present

## 2023-11-17 DIAGNOSIS — R112 Nausea with vomiting, unspecified: Secondary | ICD-10-CM | POA: Diagnosis not present

## 2023-11-17 MED ORDER — PAXLOVID (300/100) 20 X 150 MG & 10 X 100MG PO TBPK
ORAL_TABLET | ORAL | 0 refills | Status: AC
  Filled 2023-11-17: qty 30, 5d supply, fill #0

## 2023-11-17 MED ORDER — ONDANSETRON 4 MG PO TBDP
4.0000 mg | ORAL_TABLET | Freq: Three times a day (TID) | ORAL | 0 refills | Status: AC | PRN
  Filled 2023-11-17: qty 15, 5d supply, fill #0

## 2023-12-26 ENCOUNTER — Other Ambulatory Visit: Payer: Self-pay

## 2023-12-26 DIAGNOSIS — Z1331 Encounter for screening for depression: Secondary | ICD-10-CM | POA: Diagnosis not present

## 2023-12-26 DIAGNOSIS — Z79890 Hormone replacement therapy (postmenopausal): Secondary | ICD-10-CM | POA: Diagnosis not present

## 2023-12-26 DIAGNOSIS — Z01419 Encounter for gynecological examination (general) (routine) without abnormal findings: Secondary | ICD-10-CM | POA: Diagnosis not present

## 2023-12-26 MED ORDER — ESTRADIOL 0.1 MG/24HR TD PTTW
1.0000 | MEDICATED_PATCH | TRANSDERMAL | 11 refills | Status: DC
  Filled 2023-12-26 – 2024-01-16 (×2): qty 8, 28d supply, fill #0

## 2024-01-05 ENCOUNTER — Other Ambulatory Visit: Payer: Self-pay

## 2024-01-16 ENCOUNTER — Other Ambulatory Visit: Payer: Self-pay

## 2024-01-19 ENCOUNTER — Other Ambulatory Visit: Payer: Self-pay

## 2024-01-20 ENCOUNTER — Other Ambulatory Visit: Payer: Self-pay

## 2024-01-21 ENCOUNTER — Other Ambulatory Visit: Payer: Self-pay

## 2024-01-22 ENCOUNTER — Other Ambulatory Visit: Payer: Self-pay

## 2024-01-23 ENCOUNTER — Other Ambulatory Visit: Payer: Self-pay

## 2024-01-23 MED ORDER — METFORMIN HCL ER 500 MG PO TB24
1000.0000 mg | ORAL_TABLET | Freq: Every day | ORAL | 1 refills | Status: DC
  Filled 2024-01-23: qty 180, 90d supply, fill #0

## 2024-01-27 ENCOUNTER — Other Ambulatory Visit: Payer: Self-pay

## 2024-01-27 ENCOUNTER — Encounter: Payer: Self-pay | Admitting: Hematology and Oncology

## 2024-01-27 MED ORDER — FLUZONE 0.5 ML IM SUSY
0.5000 mL | PREFILLED_SYRINGE | Freq: Once | INTRAMUSCULAR | 0 refills | Status: AC
  Filled 2024-01-27: qty 0.5, 1d supply, fill #0

## 2024-02-09 DIAGNOSIS — Z1331 Encounter for screening for depression: Secondary | ICD-10-CM | POA: Diagnosis not present

## 2024-02-09 DIAGNOSIS — R7302 Impaired glucose tolerance (oral): Secondary | ICD-10-CM | POA: Diagnosis not present

## 2024-02-09 DIAGNOSIS — J301 Allergic rhinitis due to pollen: Secondary | ICD-10-CM | POA: Diagnosis not present

## 2024-02-09 DIAGNOSIS — I10 Essential (primary) hypertension: Secondary | ICD-10-CM | POA: Diagnosis not present

## 2024-02-09 DIAGNOSIS — E669 Obesity, unspecified: Secondary | ICD-10-CM | POA: Diagnosis not present

## 2024-02-09 DIAGNOSIS — Z79899 Other long term (current) drug therapy: Secondary | ICD-10-CM | POA: Diagnosis not present

## 2024-03-03 ENCOUNTER — Other Ambulatory Visit: Payer: Self-pay

## 2024-03-23 DIAGNOSIS — I10 Essential (primary) hypertension: Secondary | ICD-10-CM | POA: Diagnosis not present

## 2024-03-23 DIAGNOSIS — Z20822 Contact with and (suspected) exposure to covid-19: Secondary | ICD-10-CM | POA: Diagnosis not present

## 2024-03-23 DIAGNOSIS — R519 Headache, unspecified: Secondary | ICD-10-CM | POA: Diagnosis not present

## 2024-03-23 DIAGNOSIS — R110 Nausea: Secondary | ICD-10-CM | POA: Diagnosis not present

## 2024-03-24 ENCOUNTER — Other Ambulatory Visit: Payer: Self-pay

## 2024-03-24 DIAGNOSIS — E669 Obesity, unspecified: Secondary | ICD-10-CM | POA: Diagnosis not present

## 2024-03-24 DIAGNOSIS — M222X2 Patellofemoral disorders, left knee: Secondary | ICD-10-CM | POA: Diagnosis not present

## 2024-03-24 DIAGNOSIS — M25562 Pain in left knee: Secondary | ICD-10-CM | POA: Diagnosis not present

## 2024-03-24 DIAGNOSIS — M222X1 Patellofemoral disorders, right knee: Secondary | ICD-10-CM | POA: Diagnosis not present

## 2024-03-24 DIAGNOSIS — M25561 Pain in right knee: Secondary | ICD-10-CM | POA: Diagnosis not present

## 2024-03-24 MED ORDER — MELOXICAM 15 MG PO TABS
15.0000 mg | ORAL_TABLET | Freq: Every day | ORAL | 1 refills | Status: AC
  Filled 2024-03-24: qty 30, 30d supply, fill #0
